# Patient Record
Sex: Female | Born: 1995
Health system: Southern US, Community
[De-identification: ages and names within clinical notes are randomized; demographics above are authoritative.]

## PROBLEM LIST (undated history)

## (undated) DIAGNOSIS — F419 Anxiety disorder, unspecified: Secondary | ICD-10-CM

## (undated) DIAGNOSIS — F319 Bipolar disorder, unspecified: Secondary | ICD-10-CM

## (undated) HISTORY — DX: Anxiety disorder, unspecified: F41.9

---
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
## 2015-09-11 DIAGNOSIS — Z309 Encounter for contraceptive management, unspecified: Secondary | ICD-10-CM | POA: Diagnosis not present

## 2015-11-27 DIAGNOSIS — Z3042 Encounter for surveillance of injectable contraceptive: Secondary | ICD-10-CM | POA: Diagnosis not present

## 2016-02-19 DIAGNOSIS — Z3042 Encounter for surveillance of injectable contraceptive: Secondary | ICD-10-CM | POA: Diagnosis not present

## 2016-06-17 DIAGNOSIS — Z309 Encounter for contraceptive management, unspecified: Secondary | ICD-10-CM | POA: Diagnosis not present

## 2016-09-29 DIAGNOSIS — Z3042 Encounter for surveillance of injectable contraceptive: Secondary | ICD-10-CM | POA: Diagnosis not present

## 2016-12-22 DIAGNOSIS — Z309 Encounter for contraceptive management, unspecified: Secondary | ICD-10-CM | POA: Diagnosis not present

## 2017-01-26 ENCOUNTER — Other Ambulatory Visit: Payer: Self-pay | Admitting: Physician Assistant

## 2017-01-26 ENCOUNTER — Other Ambulatory Visit (HOSPITAL_COMMUNITY)
Admission: RE | Admit: 2017-01-26 | Discharge: 2017-01-26 | Disposition: A | Discharge status: Home / Self Care | Payer: 59 | Source: Ambulatory Visit | Attending: Physician Assistant | Admitting: Physician Assistant

## 2017-01-26 DIAGNOSIS — Z0000 Encounter for general adult medical examination without abnormal findings: Secondary | ICD-10-CM | POA: Diagnosis not present

## 2017-01-26 DIAGNOSIS — N760 Acute vaginitis: Secondary | ICD-10-CM | POA: Diagnosis not present

## 2017-01-26 DIAGNOSIS — Z720 Tobacco use: Secondary | ICD-10-CM | POA: Diagnosis not present

## 2017-01-26 DIAGNOSIS — Z23 Encounter for immunization: Secondary | ICD-10-CM | POA: Diagnosis not present

## 2017-01-26 DIAGNOSIS — F1290 Cannabis use, unspecified, uncomplicated: Secondary | ICD-10-CM | POA: Diagnosis not present

## 2017-01-26 DIAGNOSIS — L301 Dyshidrosis [pompholyx]: Secondary | ICD-10-CM | POA: Diagnosis not present

## 2017-02-01 LAB — CYTOLOGY - PAP
Diagnosis: UNDETERMINED — AB
HPV: DETECTED — AB

## 2017-02-09 DIAGNOSIS — R87810 Cervical high risk human papillomavirus (HPV) DNA test positive: Secondary | ICD-10-CM | POA: Diagnosis not present

## 2017-02-09 DIAGNOSIS — R87610 Atypical squamous cells of undetermined significance on cytologic smear of cervix (ASC-US): Secondary | ICD-10-CM | POA: Diagnosis not present

## 2017-02-09 DIAGNOSIS — Z3042 Encounter for surveillance of injectable contraceptive: Secondary | ICD-10-CM | POA: Diagnosis not present

## 2017-03-23 DIAGNOSIS — Z3042 Encounter for surveillance of injectable contraceptive: Secondary | ICD-10-CM | POA: Diagnosis not present

## 2017-03-23 DIAGNOSIS — Z309 Encounter for contraceptive management, unspecified: Secondary | ICD-10-CM | POA: Diagnosis not present

## 2017-05-25 ENCOUNTER — Encounter: Payer: Self-pay | Admitting: Physician Assistant

## 2017-05-25 ENCOUNTER — Ambulatory Visit (INDEPENDENT_AMBULATORY_CARE_PROVIDER_SITE_OTHER): Payer: 59 | Admitting: Physician Assistant

## 2017-05-25 VITALS — BP 97/59 | HR 77 | Temp 97.7°F | Resp 18 | Ht 61.81 in | Wt 115.4 lb

## 2017-05-25 DIAGNOSIS — Z8659 Personal history of other mental and behavioral disorders: Secondary | ICD-10-CM

## 2017-05-25 DIAGNOSIS — R21 Rash and other nonspecific skin eruption: Secondary | ICD-10-CM | POA: Diagnosis not present

## 2017-05-25 MED ORDER — TRIAMCINOLONE ACETONIDE 0.5 % EX CREA: 1.0000 "application " | TOPICAL_CREAM | Freq: Two times a day (BID) | CUTANEOUS | 1 refills | Status: AC

## 2017-05-25 NOTE — Patient Instructions (Addendum)
Your rash is consistent with dyshidrotic eczema. Treat is topical corticosteroids are applied to the affected palms twice daily for two to four weeks. The risk of systemic absorption of topical corticosteroids applied on palms or soles is low because of the limited area involved and greater thickness of volar skin. However, a prolonged use of superpotent topical corticosteroids is associated with cutaneous side effects including skin atrophy, striae, and telangiectasia.  If rash worsens, please notify our office as you may need oral steroids.  I have also placed a referral for psychiatry and they should contact you within 1-2 weeks for an appointment. Please follow up with them.  Also, think about stopping smoking! Please let me know if you need help with this!   Thank you for letting me participate in your health and well being.     Hand Dermatitis Hand dermatitis is a skin condition that causes small, itchy, raised dots or fluid-filled blisters to form over the palms of the hands. This condition may also be called hand eczema. What are the causes? The cause of this condition is not known. What increases the risk? This condition is more likely to develop in people who have a history of allergies, such as:  Hay fever.  Allergic asthma.  An allergy to latex.  Chemical exposure, injuries, and environmental irritants can make hand dermatitis worse. Washing your hands too often can remove natural oils, which can dry out the skin and contribute to outbreaks of this condition. What are the signs or symptoms? The most common symptom of this condition is intense itchiness. Cracks or grooves (fissures) on the fingers can also develop. Affected areas can be painful, especially areas where large blisters have formed. How is this diagnosed? This condition is diagnosed with a medical history and physical exam. How is this treated? This condition is treated with medicines, including:  Steroid  creams and ointments.  Oral steroid medicines.  Antibiotic medicines. These are prescribed if you have an infection.  Antihistamine medicines. These help to reduce itchiness.  Follow these instructions at home:  Take or apply over-the-counter and prescription medicines only as told by your health care provider.  If you were prescribed an antibiotic medicine, use it as told by your health care provider. Do not stop using the antibiotic even if you start to feel better.  Avoid washing your hands more often than necessary.  Avoid using harsh chemicals on your hands.  Wear protective gloves when you handle products that can irritate your skin.  Keep all follow-up visits as told by your health care provider. This is important. Contact a health care provider if:  Your rash does not improve during the first week of treatment.  Your rash is red or tender.  Your rash has pus coming from it.  Your rash spreads. This information is not intended to replace advice given to you by your health care provider. Make sure you discuss any questions you have with your health care provider. Document Released: 08/23/2005 Document Revised: 01/29/2016 Document Reviewed: 03/07/2015 Elsevier Interactive Patient Education  2018 [employer].    Steps to Quit Smoking Smoking tobacco can be bad for your health. It can also affect almost every organ in your body. Smoking puts you and people around you at risk for many serious long-lasting (chronic) diseases. Quitting smoking is hard, but it is one of the best things that you can do for your health. It is never too late to quit. What are the benefits of quitting  smoking? When you quit smoking, you lower your risk for getting serious diseases and conditions. They can include:  Lung cancer or lung disease.  Heart disease.  Stroke.  Heart attack.  Not being able to have children (infertility).  Weak bones (osteoporosis) and broken bones  (fractures).  If you have coughing, wheezing, and shortness of breath, those symptoms may get better when you quit. You may also get sick less often. If you are pregnant, quitting smoking can help to lower your chances of having a baby of low birth weight. What can I do to help me quit smoking? Talk with your doctor about what can help you quit smoking. Some things you can do (strategies) include:  Quitting smoking totally, instead of slowly cutting back how much you smoke over a period of time.  Going to in-person counseling. You are more likely to quit if you go to many counseling sessions.  Using resources and support systems, such as: ? Agricultural engineer with a Veterinary surgeon. ? Phone quitlines. ? Automotive engineer. ? Support groups or group counseling. ? Text messaging programs. ? Mobile phone apps or applications.  Taking medicines. Some of these medicines may have nicotine in them. If you are pregnant or breastfeeding, do not take any medicines to quit smoking unless your doctor says it is okay. Talk with your doctor about counseling or other things that can help you.  Talk with your doctor about using more than one strategy at the same time, such as taking medicines while you are also going to in-person counseling. This can help make quitting easier. What things can I do to make it easier to quit? Quitting smoking might feel very hard at first, but there is a lot that you can do to make it easier. Take these steps:  Talk to your family and friends. Ask them to support and encourage you.  Call phone quitlines, reach out to support groups, or work with a Veterinary surgeon.  Ask people who smoke to not smoke around you.  Avoid places that make you want (trigger) to smoke, such as: ? Bars. ? Parties. ? Smoke-break areas at work.  Spend time with people who do not smoke.  Lower the stress in your life. Stress can make you want to smoke. Try these things to help your stress: ? Getting  regular exercise. ? Deep-breathing exercises. ? Yoga. ? Meditating. ? Doing a body scan. To do this, close your eyes, focus on one area of your body at a time from head to toe, and notice which parts of your body are tense. Try to relax the muscles in those areas.  Download or buy apps on your mobile phone or tablet that can help you stick to your quit plan. There are many free apps, such as QuitGuide from the [employer] developer for Disease Control and Prevention). You can find more support from smokefree.gov and other websites.  This information is not intended to replace advice given to you by your health care provider. Make sure you discuss any questions you have with your health care provider. Document Released: 06/19/2009 Document Revised: 04/20/2016 Document Reviewed: 01/07/2015 Elsevier Interactive Patient Education  2018 [employer].   IF you received an x-ray today, you will receive an invoice from [facility]. Please contact [facility] at [phone] with questions or concerns regarding your invoice.   IF you received labwork today, you will receive an invoice from [facility]. Please contact [facility] at [phone] with questions or concerns regarding your invoice.  Our billing staff will not be able to assist you with questions regarding bills from these companies.  You will be contacted with the lab results as soon as they are available. The fastest way to get your results is to activate your My Chart account. Instructions are located on the last page of this paperwork. If you have not heard from us regarding the results in 2 weeks, please contact this office.     

## 2017-05-25 NOTE — Progress Notes (Signed)
   Azadeh Hyder  MRN: [id number] DOB: 1995-12-10  Subjective:   Paula Ortiz is a 21 y.o. female who presents for evaluation of a rash involving the palms of hands. Rash started years ago. Lesions are mix flat lesions, dry lesions, and blisters. Rash has changed over time. It will come and go. Rash is painful and is very pruritic. Pt denies abdominal pain, arthralgia, congestion, cough, crankiness, decrease in appetite, fever, headache, irritability, myalgia, nausea, sore throat and vomiting. Patient has had contacts with similar rash. Her mom has similar symptoms.Her sister just got a staph infection from wearing dirty gloves and washing hands to  Patient has not had new exposures (soaps, lotions, laundry detergents, foods, medications, plants, insects or animals). She is sensitive to certain metals. No hx of seasonal allergies. Has tried OTC cortisone, ointment, hand cream with no full relief. 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 Does not know if it is related to stress. Sexually active with monogamous partner. Smokes a pack of cigarettes a week.    In terms of biploar disorder, pt is not currently on medication. Used to be followed by child psych but after age 8yo, "grew out of their clinic." Would like to see another psychiatrist and discuss medication initiation. Denies homicidal and suicidal thoughts/ideations today.   Review of Systems  Psychiatric/Behavioral: Positive for dysphoric mood.    There are no active problems to display for this patient.   No current outpatient prescriptions on file prior to visit.   No current facility-administered medications on file prior to visit.     Allergies  Allergen Reactions  . Ibuprofen Hives     Objective:  BP (!) 97/59 (BP Location: Right Arm, Patient Position: Sitting, Cuff Size: Normal)   Pulse 77   Temp 97.7 F (36.5 C) (Oral)   Resp 18   Ht 5' 1.81" (1.57 m)   Wt  115 lb 6.4 oz (52.3 kg)   SpO2 97%   BMI 21.24 kg/m   Physical Exam  Constitutional: She is oriented to person, place, and time and well-developed, well-nourished, and in no distress.  HENT:  Head: Normocephalic and atraumatic.  Eyes: Conjunctivae are normal.  Neck: Normal range of motion.  Pulmonary/Chest: Effort normal.  Neurological: She is alert and oriented to person, place, and time. Gait normal.  Skin: Skin is warm and dry.  Mild amount of vesicles with surrounding mild erythema and desquamation noted on palmar aspect of digits bilaterally. See image below.   Psychiatric: Affect normal.  Vitals reviewed.     Assessment and Plan :  1. Rash and nonspecific skin eruption Consistent with dyshidrotic eczema. Will treat with high potency topical corticosteroid. Pt encouraged to return to clinic if symptoms worsen, do not improve, or as needed - RPR - triamcinolone cream (TRIDERM) 0.5 %; Apply 1 application topically 2 (two) times daily. Only apply to palms of hands.  Dispense: 30 g; Refill: 1  2. History of bipolar disorder - Ambulatory referral to Psychiatry  Benjiman Core PA-C  Primary Care at [facility] 05/25/2017 10:16 AM

## 2017-05-25 NOTE — Progress Notes (Deleted)
Subjective:     Paula Ortiz is a 21 y.o. female who presents for evaluation of a rash involving the {body part:32401}. Rash started {1-10:13787} {time; units:19136} ago. Lesions are {color:5006}, and {text:16500} in texture. Rash {has/not:18111} changed over time. Rash {rash dc:16501}. Associated symptoms: {rash assoc:16502}. Patient denies: {rash assoc:19565}. Patient {has/not:18111} had contacts with similar rash. Patient {has/not:18111} had new exposures (soaps, lotions, laundry detergents, foods, medications, plants, insects or animals).  {Common ambulatory SmartLinks:19316}  Review of Systems {ros; complete:30496}    Objective:    BP (!) 97/59 (BP Location: Right Arm, Patient Position: Sitting, Cuff Size: Normal)   Pulse 77   Temp 97.7 F (36.5 C) (Oral)   Resp 18   Ht 5' 1.81" (1.57 m)   Wt 115 lb 6.4 oz (52.3 kg)   SpO2 97%   BMI 21.24 kg/m  General:  {gen appearance:16600}  Skin:  {skin exam:30902::"normal"}     Assessment:    {derm diagnosis:16511}    Plan:    {ZOXW:96045}

## 2017-05-26 LAB — RPR: RPR Ser Ql: NONREACTIVE

## 2017-07-13 DIAGNOSIS — Z309 Encounter for contraceptive management, unspecified: Secondary | ICD-10-CM | POA: Diagnosis not present

## 2017-07-14 DIAGNOSIS — Z309 Encounter for contraceptive management, unspecified: Secondary | ICD-10-CM | POA: Diagnosis not present

## 2017-07-14 DIAGNOSIS — Z3042 Encounter for surveillance of injectable contraceptive: Secondary | ICD-10-CM | POA: Diagnosis not present

## 2017-11-02 ENCOUNTER — Encounter: Payer: Self-pay | Admitting: Physician Assistant

## 2017-11-02 ENCOUNTER — Ambulatory Visit (INDEPENDENT_AMBULATORY_CARE_PROVIDER_SITE_OTHER): Payer: 59 | Admitting: Physician Assistant

## 2017-11-02 ENCOUNTER — Other Ambulatory Visit: Payer: Self-pay

## 2017-11-02 VITALS — BP 98/60 | HR 100 | Temp 99.7°F | Resp 18 | Ht 63.66 in | Wt 113.8 lb

## 2017-11-02 DIAGNOSIS — Z3040 Encounter for surveillance of contraceptives, unspecified: Secondary | ICD-10-CM

## 2017-11-02 DIAGNOSIS — F319 Bipolar disorder, unspecified: Secondary | ICD-10-CM | POA: Diagnosis not present

## 2017-11-02 LAB — POCT URINE PREGNANCY: Preg Test, Ur: NEGATIVE

## 2017-11-02 MED ORDER — MEDROXYPROGESTERONE ACETATE 150 MG/ML IM SUSP
150.0000 mg | Freq: Once | INTRAMUSCULAR | Status: AC
  Administered 2017-11-02: 150 mg via INTRAMUSCULAR

## 2017-11-02 NOTE — Progress Notes (Signed)
Paula Langonna Farner  MRN: [id number] DOB: 07/23/1996  Subjective:  Paula Ortiz is a 22 y.o. female seen in office today for a chief complaint of need of depo injection. Has been using this for over 1 year. Last depo shot 06/2017. Was getting shots at [facility], but wants to change to here due to miscommunication with appointments there. Denies dizziness, weakness, irritability, and weight gain. LMP 10/12/17. Has been sexually active since LMP with monogamous partner, has not used condoms. No concern for STDs.    In terms of biploar disorder, pt is not currently on medication. Used to be controlled on lamictal and zoloft. Was followed by child psych but after age 22yo, "grew out of their clinic."  Would like to see another psychiatrist and discuss medication initiation. Would also like to attend therapy. She endorses more dysphoric mood recently, increased episodes of crying, decreased interest in doing daily activities, and increase sleep. Has occasional suicidal thoughts, no active plan. Denies homicidal and suicidal thoughts/ideations today.   Review of Systems  Constitutional: Negative for chills, diaphoresis, fatigue and fever.  Psychiatric/Behavioral: Negative for hallucinations and self-injury. The patient is not nervous/anxious.     There are no active problems to display for this patient.   Current Outpatient Medications on File Prior to Visit  Medication Sig Dispense Refill  . medroxyPROGESTERone (DEPO-PROVERA) 150 MG/ML injection Inject 150 mg into the muscle every 3 (three) months.     No current facility-administered medications on file prior to visit.     Allergies  Allergen Reactions  . Ibuprofen Hives     Objective:  BP 98/60 (BP Location: Left Arm, Patient Position: Sitting, Cuff Size: Normal)   Pulse 100   Temp 99.7 F (37.6 C) (Oral)   Resp 18   Ht 5' 3.66" (1.617 m)   Wt 113 lb 12.8 oz (51.6 kg)   LMP 10/12/2017 (Approximate)   SpO2 99%   BMI 19.74 kg/m   Physical  Exam  Constitutional: She is oriented to person, place, and time and well-developed, well-nourished, and in no distress.  HENT:  Head: Normocephalic and atraumatic.  Eyes: Conjunctivae are normal.  Neck: Normal range of motion.  Pulmonary/Chest: Effort normal.  Neurological: She is alert and oriented to person, place, and time. Gait normal.  Skin: Skin is warm and dry.  Psychiatric: Affect normal.  Tearful when discussing mental health   Vitals reviewed.  Results for orders placed or performed in visit on 11/02/17 (from the past 24 hour(s))  POCT urine pregnancy     Status: None   Collection Time: 11/02/17 10:42 AM  Result Value Ref Range   Preg Test, Ur Negative Negative   [facility] 11/02/2017 05/25/2017  Decreased Interest 1 2  Down, Depressed, Hopeless 3 2  PHQ - 2 Score 4 4  Altered sleeping 3 1  Tired, decreased energy 1 3  Change in appetite 2 1  Feeling bad or failure about yourself  2 3  Trouble concentrating 3 2  Moving slowly or fidgety/restless 1 1  Suicidal thoughts 3 2  PHQ-9 Score 19 17  Difficult doing work/chores Very difficult Very difficult    Assessment and Plan :  1. Encounter for surveillance of contraceptives, unspecified contraceptive POC pregnancy test negative today. Depo injection given. Return in 2 weeks for repeat urine pregnancy. If neg at that time, continue with depo injections q 3 months. Enocuraged to use back up contraceptive method, such as condoms, for the next week.  - POCT  urine pregnancy - medroxyPROGESTERone (DEPO-PROVERA) injection 150 mg 2. Bipolar affective disorder, remission status unspecified (HCC) Due to mental health history, pt warrants further evaluation and tx from psychiatry. Would also likely benefit from therapy. Referral to psych placed. This was also placed about 5 months ago and pt never followed up. States she is definitely going to follow up this time. She was also given contact info for local  therapists. She is not currently suicidal. Given educational material for suicide help lines and [facility] eval if ever needed.  - Ambulatory referral to Psychiatry  Benjiman Core PA-C  Primary Care at [facility] 11/02/2017 10:50 AM

## 2017-11-02 NOTE — Patient Instructions (Addendum)
For depo shot, please use backup contraceptive method for the next 7 days such as a condom.  Return in 2 weeks for repeat urine pregnancy test. If negative, return for next depo shot at 3 months.   For depression, I have placed referral for psych. Below is info for therapy.  For therapy -- [facility] ([address] [place]) - [phone] [facility]) - [phone] [facility] - [phone] [facility] - [phone] [place] [place] - [phone] [facility]  - [phone] (do not file insurance)   If you ever start to have suicidal thoughts or a plan, please go to [facility] ED.   Suicidal Feelings: How to Help Yourself Suicide is the taking of one's own life. If you feel as though life is getting too tough to handle and are thinking about suicide, get help right away. To get help:  Call your local emergency services (911 in the [place]  Call a suicide hotline to speak with a trained counselor who understands how you are feeling. The following is a list of suicide hotlines in the [place]. For a list of hotlines in [place], visit InkDistributor.it. ? 1-800-273-TALK 972-003-8300). ? 1-800-SUICIDE 220-533-5140). ? (773) 032-7197. This is a hotline for Spanish speakers. ? 1-800-799-4TTY 6295558850). This is a hotline for TTY users. ? 1-866-4-U-TREVOR 951 865 9087). This is a hotline for lesbian, gay, bisexual, transgender, or questioning youth.  Contact a crisis center or a local suicide prevention center. To find a crisis center or suicide prevention center: ? Call your local hospital, clinic, community service organization, mental health center, social service provider, or health department. Ask for assistance in connecting to a crisis center. ? Visit  https://www.patel-king.com/ for a list of crisis centers in the [place], or visit www.suicideprevention.ca/thinking-about-suicide/find-a-crisis-centre for a list of centers in [place].  Visit the following websites: ? National Suicide Prevention Lifeline: www.suicidepreventionlifeline.org ? Hopeline: www.hopeline.com ? [employer] for Suicide Prevention: https://www.ayers.com/ ? [employer] (for lesbian, gay, bisexual, transgender, or questioning youth): www.thetrevorproject.org  How can I help myself feel better?  Promise yourself that you will not do anything drastic when you have suicidal feelings. Remember, there is hope. Many people have gotten through suicidal thoughts and feelings, and you will, too. You may have gotten through them before, and this proves that you can get through them again.  Let family, friends, teachers, or counselors know how you are feeling. Try not to isolate yourself from those who care about you. Remember, they will want to help you. Talk with someone every day, even if you do not feel sociable. Face-to-face conversation is best.  Call a mental health professional and see one regularly.  Visit your primary health care provider every year.  Eat a well-balanced diet, and space your meals so you eat regularly.  Get plenty of rest.  Avoid alcohol and drugs, and remove them from your home. They will only make you feel worse.  If you are thinking of taking a lot of medicine, give your medicine to someone who can give it to you one day at a time. If you are on antidepressants and are concerned you will overdose, let your health care provider know so he or she can give you safer medicines. Ask your mental health professional about the possible side effects of any medicines you are taking.  Remove weapons, poisons, knives, and anything else that could harm you from your home.  Try  to stick to routines. Follow a schedule every day.  Put self-care on your schedule.  Make a list of realistic goals, and cross them off when you achieve them. Accomplishments give a sense of worth.  Wait until you are feeling better before doing the things you find difficult or unpleasant.  Exercise if you are able. You will feel better if you exercise for even a half hour each day.  Go out in the sun or into nature. This will help you recover from depression faster. If you have a favorite place to walk, go there.  Do the things that have always given you pleasure. Play your favorite music, read a good book, paint a picture, play your favorite instrument, or do anything else that takes your mind off your depression if it is safe to do.  Keep your living space well lit.  When you are feeling well, write yourself a letter about tips and support that you can read when you are not feeling well.  Remember that life's difficulties can be sorted out with help. Conditions can be treated. You can work on thoughts and strategies that serve you well. This information is not intended to replace advice given to you by your health care provider. Make sure you discuss any questions you have with your health care provider. Document Released: 02/27/2003 Document Revised: 04/21/2016 Document Reviewed: 12/18/2013 Elsevier Interactive Patient Education  2018 [employer]  IF you received an x-ray today, you will receive an invoice from [facility]. Please contact [facility] at [phone] with questions or concerns regarding your invoice.   IF you received labwork today, you will receive an [facility] from [facility]. Please contact [facility] at [phone] with questions or concerns regarding your invoice.   Our billing staff will not be able to assist you with questions regarding bills from these companies.  You will be contacted with the lab results as soon as they are available. The fastest way to get your results is to activate your My  Chart account. Instructions are located on the last page of this paperwork. If you have not heard from us regarding the results in 2 weeks, please contact this office.

## 2017-11-21 ENCOUNTER — Encounter: Payer: Self-pay | Admitting: Physician Assistant

## 2017-11-21 ENCOUNTER — Ambulatory Visit (INDEPENDENT_AMBULATORY_CARE_PROVIDER_SITE_OTHER): Payer: 59 | Admitting: Physician Assistant

## 2017-11-21 ENCOUNTER — Other Ambulatory Visit: Payer: Self-pay

## 2017-11-21 VITALS — BP 100/64 | HR 102 | Temp 99.0°F | Resp 18 | Ht 62.6 in | Wt 113.6 lb

## 2017-11-21 DIAGNOSIS — M25561 Pain in right knee: Secondary | ICD-10-CM | POA: Diagnosis not present

## 2017-11-21 DIAGNOSIS — F319 Bipolar disorder, unspecified: Secondary | ICD-10-CM

## 2017-11-21 DIAGNOSIS — M7631 Iliotibial band syndrome, right leg: Secondary | ICD-10-CM | POA: Diagnosis not present

## 2017-11-21 DIAGNOSIS — M25551 Pain in right hip: Secondary | ICD-10-CM

## 2017-11-21 DIAGNOSIS — Z3042 Encounter for surveillance of injectable contraceptive: Secondary | ICD-10-CM

## 2017-11-21 DIAGNOSIS — G8929 Other chronic pain: Secondary | ICD-10-CM | POA: Diagnosis not present

## 2017-11-21 DIAGNOSIS — M25552 Pain in left hip: Secondary | ICD-10-CM

## 2017-11-21 NOTE — Patient Instructions (Addendum)
Iliotibial Band Syndrome Iliotibial band syndrome (ITBS) is a condition that often causes knee pain. It can also cause pain in the outside of your hip, thigh, and knee. The iliotibial band is a strip of tissue that runs from the outside of your hip and down your thigh to the outside of your knee. Repeatedly bending and straightening your knee can irritate the iliotibial band. What are the causes? This condition is caused by inflammation and irritation from the friction of the iliotibial band moving over the thigh bone (femur) when you repeatedly bend and straighten your knee. What increases the risk? This condition is more likely to develop in people who:  Frequently change elevation during their workouts.  Run very long distances.  Recently increased the length or intensity of their workouts.  Run downhill often, or just started running downhill.  Ride a bike very far or often.  You may also be at greater risk if you start a new workout routine without first warming up or if you have a job that requires you to bend, squat, or climb frequently. What are the signs or symptoms? Symptoms of this condition include:  Pain along the outside of your knee that may be worse with activity, especially running or going up and down stairs.  A "snapping" sensation over your knee.  Swelling on the outside of your knee.  Pain or a feeling of tightness in your hip.  How is this diagnosed? This condition is diagnosed based on your symptoms, medical history, and physical exam. You may also see a health care provider who specializes in reducing pain and increasing mobility (physical therapist). A physical therapist may do an exam to check your balance, movement, and way of walking or running (gait) to see whether the way you move could contribute to your injury. You may also have tests to measure your strength, flexibility, and range of motion. How is this treated? Treatment for this condition  includes:  Resting and limiting exercise.  Returning to activities gradually.  Doing range-of-motion and strengthening exercises (physical therapy) as told by your health care provider.  Including low-impact activities, such as swimming, in your exercise routine.  Follow these instructions at home:  If directed, apply ice to the injured area. ? Put ice in a plastic bag. ? Place a towel between your skin and the bag. ? Leave the ice on for 20 minutes, 2-3 times per day.  Return to your normal activities as told by your health care provider. Ask your health care provider what activities are safe for you.  Keep all follow-up visits with your health care provider. This is important. Contact a health care provider if:  Your pain does not improve or gets worse despite treatment. This information is not intended to replace advice given to you by your health care provider. Make sure you discuss any questions you have with your health care provider. Document Released: 02/12/2002 Document Revised: 09/24/2016 Document Reviewed: 09/24/2016 Elsevier Interactive Patient Education  2018 [employer]  Iliotibial Band Syndrome Rehab Ask your health care provider which exercises are safe for you. Do exercises exactly as told by your health care provider and adjust them as directed. It is normal to feel mild stretching, pulling, tightness, or discomfort as you do these exercises, but you should stop right away if you feel sudden pain or your pain gets worse.Do not begin these exercises until told by your health care provider. Stretching and range of motion exercises These exercises warm up  your muscles and joints and improve the movement and flexibility of your hip and pelvis. Exercise A: Quadriceps, prone  1. Lie on your abdomen on a firm surface, such as a bed or padded floor. 2. Bend your left / right knee and hold your ankle. If you cannot reach your ankle or pant leg, loop a belt around your  foot and grab the belt instead. 3. Gently pull your heel toward your buttocks. Your knee should not slide out to the side. You should feel a stretch in the front of your thigh and knee. 4. Hold this position for __________ seconds. Repeat __________ times. Complete this stretch __________ times a day. Exercise B: Iliotibial band  1. Lie on your side with your left / right leg in the top position. 2. Bend both of your knees and grab your left / right ankle. Stretch out your bottom arm to help you balance. 3. Slowly bring your top knee back so your thigh goes behind your trunk. 4. Slowly lower your top leg toward the floor until you feel a gentle stretch on the outside of your left / right hip and thigh. If you do not feel a stretch and your knee will not fall farther, place the heel of your other foot on top of your knee and pull your knee down toward the floor with your foot. 5. Hold this position for __________ seconds. Repeat __________ times. Complete this stretch __________ times a day. Strengthening exercises These exercises build strength and endurance in your hip and pelvis. Endurance is the ability to use your muscles for a long time, even after they get tired. Exercise C: Straight leg raises ( hip abductors) 1. Lie on your side with your left / right leg in the top position. Lie so your head, shoulder, knee, and hip line up. You may bend your bottom knee to help you balance. 2. Roll your hips slightly forward so your hips are stacked directly over each other and your left / right knee is facing forward. 3. Tense the muscles in your outer thigh and lift your top leg 4-6 inches (10-15 cm). 4. Hold this position for __________ seconds. 5. Slowly return to the starting position. Let your muscles relax completely before doing another repetition. Repeat __________ times. Complete this exercise __________ times a day. Exercise D: Straight leg raises ( hip extensors) 1. Lie on your abdomen  on your bed or a firm surface. You can put a pillow under your hips if that is more comfortable. 2. Bend your left / right knee so your foot is straight up in the air. 3. Squeeze your buttock muscles and lift your left / right thigh off the bed. Do not let your back arch. 4. Tense this muscle as hard as you can without increasing any knee pain. 5. Hold this position for __________ seconds. 6. Slowly lower your leg to the starting position and allow it to relax completely. Repeat __________ times. Complete this exercise __________ times a day. Exercise E: Hip hike 1. Stand sideways on a bottom step. Stand on your left / right leg with your other foot unsupported next to the step. You can hold onto the railing or wall if needed for balance. 2. Keep your knees straight and your torso square. Then, lift your left / right hip up toward the ceiling. 3. Slowly let your left / right hip lower toward the floor, past the starting position. Your foot should get closer to the floor. Do not  lean or bend your knees. Repeat __________ times. Complete this exercise __________ times a day. This information is not intended to replace advice given to you by your health care provider. Make sure you discuss any questions you have with your health care provider. Document Released: 08/23/2005 Document Revised: 04/27/2016 Document Reviewed: 07/25/2015 Elsevier Interactive Patient Education  2018 [employer].    IF you received an x-ray today, you will receive an invoice from [facility]. Please contact [facility] at [phone] with questions or concerns regarding your invoice.   IF you received labwork today, you will receive an invoice from [facility]. Please contact [facility] at [phone] with questions or concerns regarding your invoice.   Our billing staff will not be able to assist you with questions regarding bills from these companies.  You will be contacted with the lab results as soon  as they are available. The fastest way to get your results is to activate your My Chart account. Instructions are located on the last page of this paperwork. If you have not heard from [place] regarding the results in 2 weeks, please contact this office.

## 2017-11-21 NOTE — Progress Notes (Signed)
Paula Langonna Bily  MRN: [id number] DOB: 05/11/1996  Subjective:  Paula Ortiz is a 22 y.o. female seen in office today for a chief complaint of follow up on pregnancy test and depression. Receives depo injection for contraception but was late for last injection 2 weeks ago. Had been sexually active. POC preg test 2 weeks ago negative, depo shot given. Encouraged to return in 2 weeks to ensure negative pregnancy. Pt has not been sexually active since last visit. She was also given referral at last visit for psych.  Notes they contacted her and she has an appointment with him in May.  She would also like to discuss bilateral hip pain (R>L) and right knee pain x years. Pain will often extend down lateral thigh to lateral knee. Denies swelling, redness, warmth, numbness, and tingling. Onset of the symptoms was 2 years ago. Inciting event: none.. Pain is intermittent. Aggravating symptoms include: sitting. Patient has had no prior hip or knee problems.  She used to be very active in high school sports.  She most the day when she is at work.  Does not do much strenuous exercise.  Does not stretch. Previous visits for this problem: none. Evaluation to date: none. Treatment to date: stretching, marijuana, OTC analgesics, which have been effective.  Review of Systems  Constitutional: Negative for chills, diaphoresis and fever.  Gastrointestinal: Negative for abdominal pain, nausea and vomiting.  Musculoskeletal: Negative for back pain and joint swelling.  Neurological: Negative for dizziness.  Psychiatric/Behavioral: Negative for suicidal ideas.    There are no active problems to display for this patient.   No current outpatient medications on file prior to visit.   No current facility-administered medications on file prior to visit.     Allergies  Allergen Reactions  . Ibuprofen Hives      Objective:  BP 100/64 (BP Location: Left Arm, Patient Position: Sitting, Cuff Size: Normal)   Pulse (!) 102    Temp 99 F (37.2 C) (Oral)   Resp 18   Ht 5' 2.6" (1.59 m)   Wt 113 lb 9.6 oz (51.5 kg)   SpO2 96%   BMI 20.38 kg/m   Physical Exam  Constitutional: She is oriented to person, place, and time and well-developed, well-nourished, and in no distress.  HENT:  Head: Normocephalic and atraumatic.  Eyes: Conjunctivae are normal.  Neck: Normal range of motion.  Pulmonary/Chest: Effort normal.  Musculoskeletal:       Right hip: She exhibits tenderness (with palpation of tensor fascia latae). She exhibits normal range of motion, normal strength, no bony tenderness and no swelling.       Left hip: She exhibits normal range of motion, normal strength, no tenderness, no bony tenderness and no swelling.       Right knee: Normal. She exhibits normal range of motion, no swelling, no effusion and no ecchymosis. No tenderness found.       Left knee: She exhibits normal range of motion, no swelling, no effusion and no ecchymosis. No tenderness found.  Right knee exam: Negative McMurray, anterior/posterior drawer, valgus and varus stress tests.  Neurological: She is alert and oriented to person, place, and time. Gait normal.  Reflex Scores:      Patellar reflexes are 2+ on the right side and 2+ on the left side.      Achilles reflexes are 2+ on the right side and 2+ on the left side. Muscular strength 5/5 of bilateral lower extremities.  Sensation of bilateral lower extremities intact.  Skin: Skin is warm and dry.  Psychiatric: Affect normal.  Vitals reviewed.   No results found for this or any previous visit (from the past 24 hour(s)).  Assessment and Plan :  1. Encounter for surveillance of injectable contraceptive Pt could not provide [place] with a urine sample and agreed to do lab work instead. Labs pending.  - Beta hCG quant (ref lab) 2. Bipolar affective disorder, remission status unspecified (HCC) Follow up with scheduled psych appointment.  3. Pain of both hip joints 4. Chronic pain of  right knee 5. Iliotibial band syndrome of right side Hx and PE findings suspicious for ITB syndrome (R>L). Pain with palpation of right TFL. No bony tenderness noted on exam. Recommended sx tx at this time. Given educational material for stretching. Advised to return to clinic if symptoms worsen, do not improve with current tx plan, or as needed. If no improvement, consider referral to PT.   Benjiman Core PA-C  Primary [facility] at [facility] 11/21/2017 12:25 PM

## 2017-11-22 LAB — BETA HCG QUANT (REF LAB): hCG Quant: <1 m[IU]/mL

## 2017-11-23 ENCOUNTER — Encounter: Payer: Self-pay | Admitting: Physician Assistant

## 2017-11-23 ENCOUNTER — Encounter: Payer: Self-pay | Admitting: Radiology

## 2017-12-28 ENCOUNTER — Telehealth (HOSPITAL_COMMUNITY): Payer: Self-pay

## 2018-01-09 ENCOUNTER — Encounter (HOSPITAL_COMMUNITY): Payer: Self-pay | Admitting: Psychiatry

## 2018-01-09 ENCOUNTER — Ambulatory Visit (INDEPENDENT_AMBULATORY_CARE_PROVIDER_SITE_OTHER): Payer: 59 | Admitting: Psychiatry

## 2018-01-09 VITALS — BP 110/68 | HR 89 | Ht 61.0 in | Wt 107.6 lb

## 2018-01-09 DIAGNOSIS — F411 Generalized anxiety disorder: Secondary | ICD-10-CM | POA: Diagnosis not present

## 2018-01-09 DIAGNOSIS — Z87891 Personal history of nicotine dependence: Secondary | ICD-10-CM

## 2018-01-09 DIAGNOSIS — Z630 Problems in relationship with spouse or partner: Secondary | ICD-10-CM

## 2018-01-09 DIAGNOSIS — R450 Nervousness: Secondary | ICD-10-CM | POA: Diagnosis not present

## 2018-01-09 DIAGNOSIS — F319 Bipolar disorder, unspecified: Secondary | ICD-10-CM | POA: Diagnosis not present

## 2018-01-09 DIAGNOSIS — Z818 Family history of other mental and behavioral disorders: Secondary | ICD-10-CM

## 2018-01-09 DIAGNOSIS — G4700 Insomnia, unspecified: Secondary | ICD-10-CM

## 2018-01-09 DIAGNOSIS — F4010 Social phobia, unspecified: Secondary | ICD-10-CM

## 2018-01-09 DIAGNOSIS — Z915 Personal history of self-harm: Secondary | ICD-10-CM

## 2018-01-09 MED ORDER — LAMOTRIGINE 25 MG PO TABS: ORAL_TABLET | ORAL | 0 refills | Status: DC

## 2018-01-09 MED ORDER — SERTRALINE HCL 50 MG PO TABS: ORAL_TABLET | ORAL | 0 refills | Status: DC

## 2018-01-09 NOTE — Progress Notes (Signed)
Psychiatric Initial Adult Assessment   Patient Identification: Paula Ortiz MRN:  [id number] Date of Evaluation:  01/09/2018 Referral Source: Primary care physician.  Chief Complaint:  I want to go back on my medication.  Visit Diagnosis: No diagnosis found.  History of Present Illness:  Paula Ortiz is a 22 year old Caucasian, employed, part-time student single female who came for her initial appointment.  Patient told that she had a history of bipolar disorder and she was prescribed Lamictal and Zoloft 3 years ago which she took for 1 year but stopped after she was feeling better.  She noticed her symptoms coming back in recent months.  She is been experiencing irritability, mood swing, agitation, anger, mania, feeling grandiose and passive suicidal thoughts.  In January she had a broke up with her boyfriend.  She was in a relationship for 4 years.  She regret losing her relationship.  She believed break-up was due to her unstable psychiatric condition.  She is serious to get help.  She admitted smoking weed and snorting cocaine but stopped last August.  She admitted very impulsive at times and causing self-destruction and better choices.  Lately she noticed easily tearful, crying, there are days when she does not get out of the bed and feels isolated, withdrawn but lack of motivation to do things.  She endorsed having cycles of mania when she feel very grandiose, irritable, severe anger and paranoid about her friends.  She has been sleeping only a few hours and noticed lack of appetite and weight loss.  Patient like to go back on Lamictal and Zoloft which helped her in the past.  Currently she is not seeing any therapist but like to see someone in the office.  Associated Signs/Symptoms: Depression Symptoms:  depressed mood, anhedonia, insomnia, psychomotor agitation, feelings of worthlessness/guilt, difficulty concentrating, hopelessness, anxiety, loss of energy/fatigue, disturbed sleep, (Hypo) Manic  Symptoms:  Distractibility, Flight of Ideas, Grandiosity, Impulsivity, Irritable Mood, Labiality of Mood, Anxiety Symptoms:  Excessive Worry, Social Anxiety, Psychotic Symptoms:  Paranoia, PTSD Symptoms: Negative  Past Psychiatric History: Patient had a history of bipolar disorder.  She was seeing Dr. Yetta Barre who is a pediatric psychiatrist at [facility].  She was diagnosed bipolar at age 30 but remember having symptoms most of her life.  She had a good response with Lamictal and Zoloft.  Patient denies any history of psychiatric inpatient treatment or any suicidal attempt.  She reported history of smoking cocaine and cannabis but claims to be sober since August 2018.  Previous Psychotropic Medications: Yes   Substance Abuse History in the last 12 months:  Yes.    Consequences of Substance Abuse: Negative  Past Medical History:  Past Medical History:  Diagnosis Date  . Anxiety    History reviewed. No pertinent surgical history.  Family Psychiatric History: Mother has bipolar disorder.  Family History:  Family History  Problem Relation Age of Onset  . Cancer Maternal Grandmother     Social History:   Social History   Socioeconomic History  . Marital status: Single    Spouse name: Not on file  . Number of children: 0  . Years of education: Not on file  . Highest education level: Bachelor's degree (e.g., BA, AB, BS)  Occupational History  . Not on file  Social Needs  . Financial resource strain: Not hard at all  . Food insecurity:    Worry: Never true    Inability: Never true  . Transportation needs:    Medical: No  Non-medical: No  Tobacco Use  . Smoking status: Former Smoker    Packs/day: 1.00    Last attempt to quit: 08/16/2017    Years since quitting: 0.4  . Smokeless tobacco: Never Used  Substance and Sexual Activity  . Alcohol use: Yes    Comment: very rare  . Drug use: Yes    Types: Marijuana    Comment: not really  . Sexual activity: Not  Currently    Birth control/protection: Injection  Lifestyle  . Physical activity:    Days per week: 0 days    Minutes per session: 0 min  . Stress: Very much  Relationships  . Social connections:    Talks on phone: More than three times a week    Gets together: More than three times a week    Attends religious service: Never    Active member of club or organization: No    Attends meetings of clubs or organizations: Never    Relationship status: Never married  Other Topics Concern  . Not on file  Social History Narrative  . Not on file    Additional Social History: Patient born and raised in [place].  She had a good support from her parents.  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    Allergies:   Allergies  Allergen Reactions  . Ibuprofen Hives    Metabolic Disorder Labs: No results found for: HGBA1C, MPG No results found for: PROLACTIN No results found for: CHOL, TRIG, HDL, CHOLHDL, VLDL, LDLCALC   Current Medications: No current outpatient medications on file.   No current facility-administered medications for this visit.     Neurologic: Headache: Yes Seizure: No Paresthesias:No  Musculoskeletal: Strength & Muscle Tone: within normal limits Gait & Station: normal Patient leans: N/A  Psychiatric Specialty Exam: Review of Systems  Constitutional: Positive for weight loss.  HENT: Negative.   Skin: Negative.   Psychiatric/Behavioral: Positive for depression. The patient is nervous/anxious and has insomnia.     Blood pressure 110/68, pulse 89, height  (1.549 m), weight 107 lb 9.6 oz (48.8 kg).Body mass index is 20.33 kg/m.  General Appearance: Casual  Eye Contact:  Fair  Speech:  Clear and Coherent  Volume:  Normal  Mood:  Anxious, Depressed, Hopeless and Irritable  Affect:  Constricted and Depressed  Thought Process:  Goal Directed   Orientation:  Full (Time, Place, and Person)  Thought Content:  Paranoid Ideation and Rumination  Suicidal Thoughts:  No  Homicidal Thoughts:  No  Memory:  Immediate;   Good Recent;   Good Remote;   Good  Judgement:  Good  Insight:  Fair  Psychomotor Activity:  Increased  Concentration:  Concentration: Good and Attention Span: Good  Recall:  Good  Fund of Knowledge:Good  Language: Good  Akathisia:  No  Handed:  Right  AIMS (if indicated):  0  Assets:  Communication Skills Desire for Improvement Housing Social Support  ADL's:  Intact  Cognition: WNL  Sleep:  poor   Assessment: Bipolar disorder type I.  Generalized anxiety disorder.  Plan: I review her symptoms, history, psychosocial stressors and current medication.  Patient had a good response with Lamictal and Zoloft.  She do not recall any side effects.  I will start Lamictal 25 mg daily for 1 week and then 50 mg daily.  I will also start Zoloft 25 mg daily  for 1 week and then 50 mg daily.  Discussed medication side effects and benefits.  Reminded her that if she had a rash then she need to stop the medication immediately.  Discussed safety concerns at any time having active suicidal thoughts or homicidal thought and she need to call 911 or go to local emergency room.  We will schedule appointment to see her therapist in this office for coping skills.  Follow-up in 4 weeks.  Cleotis Nipper, MD 5/6/201911:34 AM

## 2018-01-25 ENCOUNTER — Ambulatory Visit (INDEPENDENT_AMBULATORY_CARE_PROVIDER_SITE_OTHER): Payer: Self-pay | Admitting: Nurse Practitioner

## 2018-01-25 VITALS — BP 90/65 | HR 79 | Temp 97.8°F | Resp 16 | Wt 107.2 lb

## 2018-01-25 DIAGNOSIS — J019 Acute sinusitis, unspecified: Secondary | ICD-10-CM

## 2018-01-25 MED ORDER — FLUTICASONE PROPIONATE 50 MCG/ACT NA SUSP: 2.0000 | Freq: Every day | NASAL | 0 refills | Status: DC

## 2018-01-25 MED ORDER — AMOXICILLIN-POT CLAVULANATE 875-125 MG PO TABS: 1.0000 | ORAL_TABLET | Freq: Two times a day (BID) | ORAL | 0 refills | Status: AC

## 2018-01-25 NOTE — Progress Notes (Signed)
Subjective:  Paula Ortiz is a 22 y.o. female who presents for evaluation of possible sinusitis.  Symptoms include right ear pressure/pain, achiness, chills, headache described as aching, post nasal drip, productive cough with yellow colored sputum, sinus pressure, sinus pain, sneezing and sore throat.  Onset of symptoms was 7 days ago, and has been gradually worsening since that time.  Treatment to date:  cough suppressants and decongestants.  High risk factors for influenza complications:  none.  The following portions of the patient's history were reviewed and updated as appropriate:  allergies, current medications and past medical history.  Constitutional: positive for chills and fatigue, negative for anorexia, malaise and sweats Eyes: negative Ears, nose, mouth, throat, and face: positive for nasal congestion, sore throat and right ear pressure, negative for ear drainage and hoarseness Respiratory: positive for cough and sputum, negative for asthma, dyspnea on exertion, emphysema, stridor and wheezing Cardiovascular: negative Gastrointestinal: negative Neurological: positive for headaches, negative for coordination problems, gait problems, seizures, speech problems, tremors, vertigo and weakness Allergic/Immunologic: positive for hay fever Objective:  BP 90/65 (BP Location: Right Arm, Patient Position: Sitting, Cuff Size: Normal)   Pulse 79   Temp 97.8 F (36.6 C) (Oral)   Resp 16   Wt 107 lb 3.2 oz (48.6 kg)   SpO2 98%   BMI 20.26 kg/m  General appearance: alert, cooperative, fatigued and no distress Head: Normocephalic, without obvious abnormality, atraumatic Eyes: conjunctivae/corneas clear. PERRL, EOM's intact. Fundi benign. Ears: abnormal TM right ear - mucoid middle ear fluid Nose: no discharge, turbinates swollen, inflamed, moderate maxillary sinus tenderness right, mild frontal sinus tenderness bilateral Throat: lips, mucosa, and tongue normal; teeth and gums normal Lungs:  clear to auscultation bilaterally Heart: regular rate and rhythm, S1, S2 normal, no murmur, click, rub or gallop Abdomen: soft, non-tender; bowel sounds normal; no masses,  no organomegaly Pulses: 2+ and symmetric Skin: Skin color, texture, turgor normal. No rashes or lesions Lymph nodes: cervical and submandibular nodes normal Neurologic: Grossly normal    Assessment:  Acute Sinusitis    Plan:  Discussed diagnosis and treatment of sinusitis. Educational material distributed and questions answered. Suggested symptomatic OTC remedies. Nasal saline spray for congestion. Augmentin per orders. Nasal steroids per orders. Follow up as needed.  Meds ordered this encounter  Medications  . amoxicillin-clavulanate (AUGMENTIN) 875-125 MG tablet    Sig: Take 1 tablet by mouth 2 (two) times daily for 10 days.    Dispense:  20 tablet    Refill:  0    Order Specific Question:   Supervising Provider    Answer:   Stacie Glaze [[id number]]  . fluticasone (FLONASE) 50 MCG/ACT nasal spray    Sig: Place 2 sprays into both nostrils daily for 10 days.    Dispense:  16 g    Refill:  0    Order Specific Question:   Supervising Provider    Answer:   Stacie Glaze [phone]

## 2018-01-25 NOTE — Patient Instructions (Signed)

## 2018-01-27 ENCOUNTER — Telehealth: Payer: Self-pay

## 2018-01-27 NOTE — Telephone Encounter (Signed)
Tried reaching out to patient regarding her visit with [place] and pt hung up the phone on me.

## 2018-02-13 ENCOUNTER — Ambulatory Visit (INDEPENDENT_AMBULATORY_CARE_PROVIDER_SITE_OTHER): Payer: 59 | Admitting: Psychiatry

## 2018-02-13 ENCOUNTER — Encounter (HOSPITAL_COMMUNITY): Payer: Self-pay | Admitting: Psychiatry

## 2018-02-13 DIAGNOSIS — R454 Irritability and anger: Secondary | ICD-10-CM

## 2018-02-13 DIAGNOSIS — F1211 Cannabis abuse, in remission: Secondary | ICD-10-CM

## 2018-02-13 DIAGNOSIS — R450 Nervousness: Secondary | ICD-10-CM

## 2018-02-13 DIAGNOSIS — Z87891 Personal history of nicotine dependence: Secondary | ICD-10-CM

## 2018-02-13 DIAGNOSIS — F1099 Alcohol use, unspecified with unspecified alcohol-induced disorder: Secondary | ICD-10-CM | POA: Diagnosis not present

## 2018-02-13 DIAGNOSIS — F411 Generalized anxiety disorder: Secondary | ICD-10-CM

## 2018-02-13 DIAGNOSIS — R4587 Impulsiveness: Secondary | ICD-10-CM | POA: Diagnosis not present

## 2018-02-13 DIAGNOSIS — F319 Bipolar disorder, unspecified: Secondary | ICD-10-CM | POA: Diagnosis not present

## 2018-02-13 MED ORDER — SERTRALINE HCL 50 MG PO TABS: 50.0000 mg | ORAL_TABLET | Freq: Every day | ORAL | 1 refills | Status: DC

## 2018-02-13 MED ORDER — LAMOTRIGINE 100 MG PO TABS: 100.0000 mg | ORAL_TABLET | Freq: Every day | ORAL | 1 refills | Status: DC

## 2018-02-13 NOTE — Progress Notes (Signed)
BH MD/PA/NP OP Progress Note  02/13/2018 12:35 PM Paula Ortiz  MRN:  [id number]  Chief Complaint: I am feeling better but still I have anxiety and irritability.  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  She was seen first time 4 weeks ago.  She has a history of bipolar disorder and anxiety.  She came because she was having symptoms of mania, irritability, mood swings and highs and lows.  In January she had a broke up with her boyfriend.  She was in a relationship for past 4 years.  She regret losing her relationship because of her symptoms.  She had a good response with Lamictal and Zoloft.  We started her back on Lamictal and Zoloft.  She is taking 25 mg Lamictal twice a day and Zoloft 50 mg daily.  She noticed improvement in her irritability and impulsive behavior but she still feel anxious and sometime nervous.  Her sleep is fair.  She still have episodes of crying and she regret about losing her relationship.  She is working and also a Consulting civil engineer at [employer]  She denies any paranoia or any hallucination.  She denies any suicidal thoughts or homicidal thought.  She gets emotional and tearful when she talked about her past.  She feels proud that she is not drinking or using any illegal substances.  Her appetite is is fair.  She is more social and active.  She has no rash, itching, tremors or shakes.  Patient lives with her parents and siblings.  She had a good support from her parents.  Visit Diagnosis:    ICD-10-CM   1. GAD (generalized anxiety disorder) F41.1 sertraline (ZOLOFT) 50 MG tablet  2. Bipolar I disorder (HCC) F31.9 lamoTRIgine (LAMICTAL) 100 MG tablet    Past Psychiatric History: Reviewed. Patient was seeing pediatric specialist Dr. Yetta Barre at [facility] since age 33.  She diagnosed with bipolar disorder.  She had a good response with Lamictal and Zoloft.  She also took Abilify.  Patient denies any history of psychiatric  inpatient treatment or any suicidal attempt.  She had a history of smoking cocaine and cannabis but claims to be sober since August 2018.  Past Medical History:  Past Medical History:  Diagnosis Date  . Anxiety    No past surgical history on file.  Family Psychiatric History: Reviewed.  Family History:  Family History  Problem Relation Age of Onset  . Cancer Maternal Grandmother     Social History:  Social History   Socioeconomic History  . Marital status: Single    Spouse name: Not on file  . Number of children: 0  . Years of education: Not on file  . Highest education level: Bachelor's degree (e.g., BA, AB, BS)  Occupational History  . Not on file  Social Needs  . Financial resource strain: Not hard at all  . Food insecurity:    Worry: Never true    Inability: Never true  . Transportation needs:    Medical: No    Non-medical: No  Tobacco Use  . Smoking status: Former Smoker    Packs/day: 1.00    Last attempt to quit: 08/16/2017    Years since quitting: 0.4  . Smokeless tobacco: Never Used  Substance and Sexual Activity  . Alcohol use: Yes    Comment: very rare  . Drug use: Yes    Types: Marijuana    Comment: not really  . Sexual activity: Not Currently  Birth control/protection: Injection  Lifestyle  . Physical activity:    Days per week: 0 days    Minutes per session: 0 min  . Stress: Very much  Relationships  . Social connections:    Talks on phone: More than three times a week    Gets together: More than three times a week    Attends religious service: Never    Active member of club or organization: No    Attends meetings of clubs or organizations: Never    Relationship status: Never married  Other Topics Concern  . Not on file  Social History Narrative  . Not on file    Allergies:  Allergies  Allergen Reactions  . Ibuprofen Hives    Metabolic Disorder Labs: No results found for: HGBA1C, MPG No results found for: PROLACTIN No results  found for: CHOL, TRIG, HDL, CHOLHDL, VLDL, LDLCALC No results found for: TSH  Therapeutic Level Labs: No results found for: LITHIUM No results found for: VALPROATE No components found for:  CBMZ  Current Medications: Current Outpatient Medications  Medication Sig Dispense Refill  . fluticasone (FLONASE) 50 MCG/ACT nasal spray Place 2 sprays into both nostrils daily for 10 days. 16 g 0  . lamoTRIgine (LAMICTAL) 25 MG tablet Take one tab daily for 1 week and than 2 tab daily 60 tablet 0  . sertraline (ZOLOFT) 50 MG tablet Take 1/2 tab daily for 1 week and than full tab daily 30 tablet 0   No current facility-administered medications for this visit.      Musculoskeletal: Strength & Muscle Tone: within normal limits Gait & Station: normal Patient leans: N/A  Psychiatric Specialty Exam: Review of Systems  Skin: Negative for itching and rash.  Psychiatric/Behavioral: The patient is nervous/anxious.     Blood pressure 98/68, pulse 88, height 5\' 1"  (1.549 m), weight 108 lb (49 kg), SpO2 100 %.There is no height or weight on file to calculate BMI.  General Appearance: Casual and Emotional and tearful  Eye Contact:  Good  Speech:  Clear and Coherent  Volume:  Normal  Mood:  Anxious  Affect:  Congruent  Thought Process:  Goal Directed  Orientation:  Full (Time, Place, and Person)  Thought Content: Logical   Suicidal Thoughts:  No  Homicidal Thoughts:  No  Memory:  Immediate;   Good Recent;   Good Remote;   Good  Judgement:  Good  Insight:  Good  Psychomotor Activity:  Normal  Concentration:  Concentration: Good and Attention Span: Good  Recall:  Good  Fund of Knowledge: Good  Language: Good  Akathisia:  No  Handed:  Right  AIMS (if indicated): not done  Assets:  Communication Skills Desire for Improvement Housing Social Support Talents/Skills  ADL's:  Intact  Cognition: WNL  Sleep:  Fair   Screenings: GAD-7     Office Visit from 11/21/2017 in Primary Care at  [place] Office Visit from 11/02/2017 in Primary Care at [facility]  Total GAD-7 Score  17  16    PHQ2-9     Office Visit from 11/21/2017 in Primary Care at [facility] Visit from 11/02/2017 in Primary Care at [facility] Visit from 05/25/2017 in Primary Care at [facility] Total Score  4  4  4   PHQ-9 Total Score  20  19  17        Assessment and Plan: Bipolar disorder type I.  Generalized anxiety disorder.  Cannabis abuse.  Patient doing better since she started Lamictal and Zoloft.  However she still have episodic mood swings and she still feel anxious and nervous.  Recommended to try Lamictal 100 mg daily.  We received records from [facility].  She used to take Lamictal 150 mg daily in the past.  She also tried Abilify but it is unclear why it was discontinued.  I also encouraged to see a therapist and patient agreed to see a counselor in this office.  We will schedule appointment to see a therapist in this office.  [facility] as patient used to see Dr. Renato Gailseed and she had blood work done.  Recommended to call us back.  Reminded that if Lamictal causes a rash then she need to call us immediately.  I will see her again in 6 weeks.   Cleotis NipperSyed T Zyonna Vardaman, MD 02/13/2018, 12:35 PM

## 2018-02-16 ENCOUNTER — Ambulatory Visit (INDEPENDENT_AMBULATORY_CARE_PROVIDER_SITE_OTHER): Payer: 59 | Admitting: Physician Assistant

## 2018-02-16 ENCOUNTER — Other Ambulatory Visit: Payer: Self-pay

## 2018-02-16 ENCOUNTER — Encounter: Payer: Self-pay | Admitting: Physician Assistant

## 2018-02-16 ENCOUNTER — Telehealth: Payer: Self-pay

## 2018-02-16 VITALS — BP 94/62 | HR 84 | Temp 98.0°F | Resp 18 | Ht 63.5 in | Wt 108.6 lb

## 2018-02-16 DIAGNOSIS — D649 Anemia, unspecified: Secondary | ICD-10-CM | POA: Diagnosis not present

## 2018-02-16 DIAGNOSIS — Z789 Other specified health status: Secondary | ICD-10-CM

## 2018-02-16 DIAGNOSIS — IMO0001 Reserved for inherently not codable concepts without codable children: Secondary | ICD-10-CM

## 2018-02-16 DIAGNOSIS — Z114 Encounter for screening for human immunodeficiency virus [HIV]: Secondary | ICD-10-CM

## 2018-02-16 DIAGNOSIS — Z23 Encounter for immunization: Secondary | ICD-10-CM

## 2018-02-16 DIAGNOSIS — I959 Hypotension, unspecified: Secondary | ICD-10-CM

## 2018-02-16 LAB — POCT CBC
Granulocyte percent: 51.1 %G (ref 37–80)
HCT, POC: 37.5 % — AB (ref 37.7–47.9)
Hemoglobin: 11.8 g/dL — AB (ref 12.2–16.2)
Lymph, poc: 2.8 (ref 0.6–3.4)
MCH, POC: 21.6 pg — AB (ref 27–31.2)
MCHC: 31.5 g/dL — AB (ref 31.8–35.4)
MCV: 68.5 fL — AB (ref 80–97)
MID (cbc): 0.2 (ref 0–0.9)
MPV: 9.2 fL (ref 0–99.8)
POC Granulocyte: 3.2 (ref 2–6.9)
POC LYMPH PERCENT: 45.7 %L (ref 10–50)
POC MID %: 3.2 %M (ref 0–12)
Platelet Count, POC: 250 10*3/uL (ref 142–424)
RBC: 5.48 M/uL (ref 4.04–5.48)
RDW, POC: 15.1 %
WBC: 6.2 10*3/uL (ref 4.6–10.2)

## 2018-02-16 LAB — POCT URINE PREGNANCY: Preg Test, Ur: NEGATIVE

## 2018-02-16 MED ORDER — MEDROXYPROGESTERONE ACETATE 150 MG/ML IM SUSP
150.0000 mg | Freq: Once | INTRAMUSCULAR | Status: AC
Start: 2018-02-16 — End: 2018-02-16
  Administered 2018-02-16: 150 mg via INTRAMUSCULAR

## 2018-02-16 NOTE — Progress Notes (Signed)
Calaya Gildner  MRN: [id number] DOB: 07/09/1996  Subjective:  Paula Ortiz is a 22 y.o. female seen in office today for a chief complaint of need depo injection. She is overdue for her injection. Last given depo injection was 11/02/17.Currently on menstrual cycle. Had unportected sex at very beginning of menstrual cycle but not in the past few days. She missed last depo injection because she and her boyfriend broke up but they recently got back together. In terms of low blood pressure, pt has baseline low bp and anemia. Does not drink much water. Does not take iron supplements. Will sometimes get dizzy if she stands up too quickly but not all the time. Today, denies dizziness, lightheadedness, SOB, fatigue, blurred vision, and heart palpitations.    Review of Systems  Constitutional: Negative for chills, diaphoresis and fever.  Gastrointestinal: Negative for abdominal pain, diarrhea, nausea and vomiting.  Neurological: Negative for headaches.      There are no active problems to display for this patient.   Current Outpatient Medications on File Prior to Visit  Medication Sig Dispense Refill  . lamoTRIgine (LAMICTAL) 100 MG tablet Take 1 tablet (100 mg total) by mouth daily. 30 tablet 1  . sertraline (ZOLOFT) 50 MG tablet Take 1 tablet (50 mg total) by mouth daily. 30 tablet 1  . fluticasone (FLONASE) 50 MCG/ACT nasal spray Place 2 sprays into both nostrils daily for 10 days. 16 g 0   No current facility-administered medications on file prior to visit.     Allergies  Allergen Reactions  . Ibuprofen Hives     Objective:  BP 94/62 (BP Location: Left Arm, Patient Position: Sitting, Cuff Size: Normal)   Pulse 84   Temp 98 F (36.7 C) (Oral)   Resp 18   Ht 5' 3.5" (1.613 m)   Wt 108 lb 9.6 oz (49.3 kg)   LMP 02/09/2018 (Approximate)   SpO2 98%   BMI 18.93 kg/m   Physical Exam  Constitutional: She is oriented to person, place, and time. She appears well-developed and  well-nourished.  HENT:  Head: Normocephalic and atraumatic.  Eyes: Conjunctivae are normal.  Neck: Normal range of motion.  Cardiovascular: Normal rate, regular rhythm, normal heart sounds and intact distal pulses.  Pulmonary/Chest: Effort normal and breath sounds normal. She has no wheezes. She has no rales.  Neurological: She is alert and oriented to person, place, and time.  Skin: Skin is warm and dry.  Psychiatric: She has a normal mood and affect.  Vitals reviewed.    BP Readings from Last 3 Encounters:  02/16/18 94/62  01/25/18 90/65  11/21/17 100/64   Orthostatic VS for the past 24 hrs:  BP- Lying Pulse- Lying BP- Sitting Pulse- Sitting BP- Standing at 0 minutes Pulse- Standing at 0 minutes  02/16/18 1116 96/61 76 (!) 89/61 84 (!) 89/63 87    Results for orders placed or performed in visit on 02/16/18 (from the past 24 hour(s))  POCT urine pregnancy     Status: None   Collection Time: 02/16/18 11:07 AM  Result Value Ref Range   Preg Test, Ur Negative Negative    Results for orders placed or performed in visit on 02/16/18 (from the past 24 hour(s))  POCT urine pregnancy     Status: None   Collection Time: 02/16/18 11:07 AM  Result Value Ref Range   Preg Test, Ur Negative Negative  POCT CBC     Status: Abnormal   Collection Time: 02/16/18 11:55 AM  Result  Value Ref Range   WBC 6.2 4.6 - 10.2 K/uL   Lymph, poc 2.8 0.6 - 3.4   POC LYMPH PERCENT 45.7 10 - 50 %L   MID (cbc) 0.2 0 - 0.9   POC MID % 3.2 0 - 12 %M   POC Granulocyte 3.2 2 - 6.9   Granulocyte percent 51.1 37 - 80 %G   RBC 5.48 4.04 - 5.48 M/uL   Hemoglobin 11.8 (A) 12.2 - 16.2 g/dL   HCT, POC 16.137.5 (A) 09.637.7 - 47.9 %   MCV 68.5 (A) 80 - 97 fL   MCH, POC 21.6 (A) 27 - 31.2 pg   MCHC 31.5 (A) 31.8 - 35.4 g/dL   RDW, POC 04.515.1 %   Platelet Count, POC 250 142 - 424 K/uL   MPV 9.2 0 - 99.8 fL     Assessment and Plan :  1. Contraception Pt currently on menstural cycle, POC preg test negative. Low risk of  pt being pregnant. Explained risks vs benefits of depo injfection. She agrees to have injection. Recommend back up contraception for next 7 days. Return in scheduled time frame for next injection. Pt agrees to do so.  - POCT urine pregnancy - medroxyPROGESTERone (DEPO-PROVERA) injection 150 mg  2. Hypotension, unspecified hypotension type Given 8 oz cup of water to drink in office and bp improved. She is asx. Rec increase water consumption to at least 64 oz daily.  - Orthostatic vital signs - POCT CBC  3. Screening for HIV (human immunodeficiency virus) - HIV antibody  4. Anemia, unspecified type Rec repeating CBC when pt is off menstrual cycle.    Side effects, risks, benefits, and alternatives of the medications and treatment plan prescribed today were discussed, and patient expressed understanding of the instructions given. No barriers to understanding were identified. Red flags discussed in detail. Pt expressed understanding regarding what to do in case of emergency/urgent symptoms.  Benjiman CoreBrittany Wiseman PA-C  [facility] at [facility]  [facility] 02/16/2018 11:22 AM

## 2018-02-16 NOTE — Patient Instructions (Addendum)
Start drinking at least 64 oz of water a day. This will help with your low blood pressure. If you start to get lightheaded,dizzy, short of breath, or heart beating very fast, please seek care immediately.  For depo. Please return between August 29-Sept 12th for next injection.   Hypotension As your heart beats, it forces blood through your body. This force is called blood pressure. If you have hypotension, you have low blood pressure. When your blood pressure is too low, you may not get enough blood to your brain. You may feel weak, feel light-headed, have a fast heartbeat, or even pass out (faint). Follow these instructions at home: Eating and drinking  Drink enough fluids to keep your pee (urine) clear or pale yellow.  Eat a healthy diet, and follow instructions from your doctor about eating or drinking restrictions. A healthy diet includes: ? Fresh fruits and vegetables. ? Whole grains. ? Low-fat (lean) meats. ? Low-fat dairy products.  Eat extra salt only as told. Do not add extra salt to your diet unless your doctor tells you to.  Eat small meals often.  Avoid standing up quickly after you eat. Medicines  Take over-the-counter and prescription medicines only as told by your doctor. ? Follow instructions from your doctor about changing how much you take (the dosage) of your medicines, if this applies. ? Do not stop or change your medicine on your own. General instructions  Wear compression stockings as told by your doctor.  Get up slowly from lying down or sitting.  Avoid hot showers and a lot of heat as told by your doctor.  Return to your normal activities as told by your doctor. Ask what activities are safe for you.  Do not use any products that contain nicotine or tobacco, such as cigarettes and e-cigarettes. If you need help quitting, ask your doctor.  Keep all follow-up visits as told by your doctor. This is important. Contact a doctor if:  You throw up  (vomit).  You have watery poop (diarrhea).  You have a fever for more than 2-3 days.  You feel more thirsty than normal.  You feel weak and tired. Get help right away if:  You have chest pain.  You have a fast or irregular heartbeat.  You lose feeling (get numbness) in any part of your body.  You cannot move your arms or your legs.  You have trouble talking.  You get sweaty or feel light-headed.  You faint.  You have trouble breathing.  You have trouble staying awake.  You feel confused. This information is not intended to replace advice given to you by your health care provider. Make sure you discuss any questions you have with your health care provider. Document Released: 11/17/2009 Document Revised: 05/11/2016 Document Reviewed: 05/11/2016 Elsevier Interactive Patient Education  2017 [employer]   IF you received an x-ray today, you will receive an invoice from [facility]. Please contact [facility] at [phone] with questions or concerns regarding your invoice.   IF you received labwork today, you will receive an invoice from [facility]. Please contact [facility] at [phone] with questions or concerns regarding your invoice.   Our billing staff will not be able to assist you with questions regarding bills from these companies.  You will be contacted with the lab results as soon as they are available. The fastest way to get your results is to activate your My Chart account. Instructions are located on the last page of this paperwork. If you  have not heard from [place] regarding the results in 2 weeks, please contact this office.

## 2018-02-16 NOTE — Telephone Encounter (Signed)
-----   Message from Magdalene RiverBrittany D Wiseman, PA-C sent at 02/16/2018  1:24 PM EDT ----- Could you please call patient and report that her blood count does show that she is mildly anemic.  We will need to repeat her CBC when she is off her menstrual cycle.  Also,  please remind patient that she needs to use back-up contraceptive method such as condoms for the next 7 days.

## 2018-02-17 LAB — HIV ANTIBODY (ROUTINE TESTING W REFLEX): HIV Screen 4th Generation wRfx: NONREACTIVE

## 2018-02-20 NOTE — Telephone Encounter (Signed)
Left a detailed message per release

## 2018-02-24 ENCOUNTER — Ambulatory Visit (INDEPENDENT_AMBULATORY_CARE_PROVIDER_SITE_OTHER): Payer: 59 | Admitting: Licensed Clinical Social Worker

## 2018-02-24 ENCOUNTER — Encounter (HOSPITAL_COMMUNITY): Payer: Self-pay | Admitting: Licensed Clinical Social Worker

## 2018-02-24 DIAGNOSIS — F411 Generalized anxiety disorder: Secondary | ICD-10-CM | POA: Diagnosis not present
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
## 2018-03-31 ENCOUNTER — Ambulatory Visit (HOSPITAL_COMMUNITY): Payer: Self-pay | Admitting: Psychiatry

## 2018-04-03 ENCOUNTER — Ambulatory Visit (HOSPITAL_COMMUNITY): Payer: Self-pay | Admitting: Licensed Clinical Social Worker

## 2018-04-25 ENCOUNTER — Other Ambulatory Visit (HOSPITAL_COMMUNITY): Payer: Self-pay

## 2018-04-25 DIAGNOSIS — F319 Bipolar disorder, unspecified: Secondary | ICD-10-CM

## 2018-04-25 DIAGNOSIS — F411 Generalized anxiety disorder: Secondary | ICD-10-CM

## 2018-04-25 MED ORDER — SERTRALINE HCL 50 MG PO TABS: 50.0000 mg | ORAL_TABLET | Freq: Every day | ORAL | 1 refills | Status: DC

## 2018-04-25 MED ORDER — LAMOTRIGINE 100 MG PO TABS: 100.0000 mg | ORAL_TABLET | Freq: Every day | ORAL | 1 refills | Status: DC

## 2018-05-08 ENCOUNTER — Encounter

## 2018-05-08 ENCOUNTER — Ambulatory Visit (HOSPITAL_COMMUNITY): Payer: Self-pay | Admitting: Licensed Clinical Social Worker

## 2018-05-15 ENCOUNTER — Ambulatory Visit (INDEPENDENT_AMBULATORY_CARE_PROVIDER_SITE_OTHER): Payer: 59 | Admitting: Physician Assistant

## 2018-05-15 DIAGNOSIS — Z3042 Encounter for surveillance of injectable contraceptive: Secondary | ICD-10-CM | POA: Diagnosis not present

## 2018-05-15 DIAGNOSIS — IMO0001 Reserved for inherently not codable concepts without codable children: Secondary | ICD-10-CM

## 2018-05-15 DIAGNOSIS — Z3040 Encounter for surveillance of contraceptives, unspecified: Secondary | ICD-10-CM

## 2018-05-15 NOTE — Progress Notes (Signed)
Pt is here for her routine Depo-Provera inj. Pt is within her scheduled window. Pt receive inj in her right deltoid. Pt tolerated inj well with no signs or indications of adverse reactions. Pt was let go

## 2018-05-16 ENCOUNTER — Ambulatory Visit (INDEPENDENT_AMBULATORY_CARE_PROVIDER_SITE_OTHER): Payer: 59 | Admitting: Licensed Clinical Social Worker

## 2018-05-16 ENCOUNTER — Encounter

## 2018-05-16 DIAGNOSIS — F319 Bipolar disorder, unspecified: Secondary | ICD-10-CM

## 2018-05-16 DIAGNOSIS — F411 Generalized anxiety disorder: Secondary | ICD-10-CM

## 2018-05-17 ENCOUNTER — Encounter (HOSPITAL_COMMUNITY): Payer: Self-pay | Admitting: Licensed Clinical Social Worker

## 2018-05-17 NOTE — Progress Notes (Signed)
   THERAPIST PROGRESS NOTE  Session Time: 10-11  Participation Level: Active  Behavioral Response: Casual and Well GroomedAlertDepressed  Type of Therapy: Individual Therapy  Treatment Goals addressed: Anxiety Treatment Goals recorded.  Interventions: CBT  Summary: Paula Ortiz is a 22 y.o. female who presents with previous dx of Bipolar 1.   Subjective: "I try so hard to help my family be closer but it's like they don't want it. It's so painful, I don't know what to do w/ it."  Pt is engaged, polite, makes strong eye contact, and is labile throughout--laughs and cries easily in session. She states she has quit her job and is focusing on her 3 college classes at [employer] She reports on a recent family camping trip in which she felt her family was "dumping on her" by criticisizing her and complaining often. Pt reports highly dysfunctional relationship dynamics among her 4 siblings. Counselor spends time joining w/ pt, getting to know her long story of feeling "other than" and that "something is wrong w/ me". Pt identifies strongly rooted negative core beliefs and becomes tearful easily when she discusses her poor relationships w/ her younger sisters. Pt expresses strong regret for leaving the house when she was 18 after an argument w/ her parents. Pt reports she is excited about recently moving in w/ a roommate who is supportive and healthy since this will be the first time pt is living alone in a supportive environment and feels stable. She hopes to assert her own adulthood and begin to practice healthy routines of yoga, school, and part time work.   Suicidal/Homicidal: Nowithout intent/plan  Therapist Response: Counselor used open questions, active listening, encouragement, reflection of emotions. Counselor spent time building rapport and establishing working relationship. Counselor and pt discussed treatment plan, goals, and documented and signed treatment plan. Pt appears motivated, extremely  emotionally reactive, and lacks self esteem and self compassion. Pt states she primarily wants to work on gaining insight into her bx patterns and emotional triggers of family.  Plan: Return again in 2 weeks. Address goals of insight and changing negative core beliefs.  Diagnosis:    ICD-10-CM   1. GAD (generalized anxiety disorder) F41.1   2. Bipolar I disorder [facility]) F31.9        Margo Common, LCAS-A 05/17/2018

## 2018-06-14 ENCOUNTER — Encounter (HOSPITAL_COMMUNITY): Payer: Self-pay | Admitting: Licensed Clinical Social Worker

## 2018-06-14 ENCOUNTER — Ambulatory Visit (INDEPENDENT_AMBULATORY_CARE_PROVIDER_SITE_OTHER): Payer: 59 | Admitting: Licensed Clinical Social Worker

## 2018-06-14 DIAGNOSIS — F319 Bipolar disorder, unspecified: Secondary | ICD-10-CM

## 2018-06-14 DIAGNOSIS — F411 Generalized anxiety disorder: Secondary | ICD-10-CM | POA: Diagnosis not present

## 2018-06-14 NOTE — Progress Notes (Signed)
   THERAPIST PROGRESS NOTE  Session Time: 2-3  Participation Level: Active  Behavioral Response: Casual and Well GroomedAlertEuthymic  Type of Therapy: Individual Therapy  Treatment Goals addressed: Anxiety  Interventions: CBT and Solution Focused  Summary: Laloni Rowton is a 22 y.o. female who presents with Bipolar 1 and GAD w/ hx of Alcohol Use Disorder, Severe currently in remission.  "I'm bad at self accountability. I procrastinate w/ school work."  Pt is active, engaged, laughs inappropriately, w/ blunted tearfulness in session. She states she is mostly good. Mood has been stable last few weeks. She continues to stay sober. Pt states she wants to work on her organization and accountability skills for not procrastinating and completing tasks on time. Counselor and pt discuss methods for increasing her motivation to achieve her goals including measurability and setting clear, short term goals.     Suicidal/Homicidal: Nowithout intent/plan  Therapist Response: Counselor used open question, active listening, and exploration of pt's complaints. Counselor helped pt gain insight into her problematic study skills and procrastination. Pt appears motivated by creativity and self indulgent tasks such as movie watching or spending time w/ cat. Pt admits she does not like to do things that she feels she is not great at.   Plan: Return again in 2 weeks.  Diagnosis:    ICD-10-CM   1. GAD (generalized anxiety disorder) F41.1   2. Bipolar I disorder [facility]) F31.9        Margo Common, LCAS-A 06/14/2018

## 2018-06-16 ENCOUNTER — Ambulatory Visit (HOSPITAL_COMMUNITY): Payer: Self-pay | Admitting: Psychiatry

## 2018-07-11 ENCOUNTER — Other Ambulatory Visit (HOSPITAL_COMMUNITY): Payer: Self-pay

## 2018-07-11 DIAGNOSIS — F319 Bipolar disorder, unspecified: Secondary | ICD-10-CM

## 2018-07-11 DIAGNOSIS — F411 Generalized anxiety disorder: Secondary | ICD-10-CM

## 2018-07-11 MED ORDER — SERTRALINE HCL 50 MG PO TABS: 50.0000 mg | ORAL_TABLET | Freq: Every day | ORAL | 0 refills | Status: DC

## 2018-07-11 MED ORDER — LAMOTRIGINE 100 MG PO TABS: 100.0000 mg | ORAL_TABLET | Freq: Every day | ORAL | 0 refills | Status: DC

## 2018-07-12 MED FILL — lamoTRIgine 100 MG TABS: 100 | 30 days supply | Qty: 30 | Fill #0

## 2018-07-12 MED FILL — SERTRALINE HCL 50 MG TABLET: 50 | 30 days supply | Qty: 30 | Fill #0

## 2018-07-17 ENCOUNTER — Encounter (HOSPITAL_COMMUNITY): Payer: Self-pay | Admitting: Psychiatry

## 2018-07-17 ENCOUNTER — Ambulatory Visit (INDEPENDENT_AMBULATORY_CARE_PROVIDER_SITE_OTHER): Payer: 59 | Admitting: Psychiatry

## 2018-07-17 ENCOUNTER — Encounter

## 2018-07-17 DIAGNOSIS — F411 Generalized anxiety disorder: Secondary | ICD-10-CM

## 2018-07-17 DIAGNOSIS — F319 Bipolar disorder, unspecified: Secondary | ICD-10-CM

## 2018-07-17 MED ORDER — SERTRALINE HCL 50 MG PO TABS: 50.0000 mg | ORAL_TABLET | Freq: Every day | ORAL | 0 refills | Status: DC

## 2018-07-17 MED ORDER — LAMOTRIGINE 100 MG PO TABS: 100.0000 mg | ORAL_TABLET | Freq: Every day | ORAL | 0 refills | Status: DC

## 2018-07-17 NOTE — Progress Notes (Signed)
BH MD/PA/NP OP Progress Note  07/17/2018 2:47 PM Paula Ortiz  MRN:  [id number]  Chief Complaint: I am doing good.  I am taking my medication.  HPI: Paula Ortiz came for her follow-up appointment.  She is taking her medication and denies any major episodes of severe mood swings.  She is back to her old boyfriend and things are going well.  She also seeing Dimas Chyle for therapy.  She is going to school at [employer] and hoping to finish her school next year.  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  She denies any mania, psychosis, hallucination.  She has no rash, itching or tremors.  Her sleep is good.  She denies any feeling of hopelessness or worthlessness.  Her anxiety is much controlled on Zoloft.  She denies any panic attacks.  She feels proud that she has been not drinking since August 2018.  She is active and social.  She had a good support from her parents.  She lives with her parents and sibling.  She wants to continue her current medication.  Visit Diagnosis:    ICD-10-CM   1. GAD (generalized anxiety disorder) F41.1 sertraline (ZOLOFT) 50 MG tablet  2. Bipolar I disorder (HCC) F31.9 lamoTRIgine (LAMICTAL) 100 MG tablet    Past Psychiatric History: Reviewed. Patient saw Dr. Yetta Barre at [facility] since age 77.  She diagnosed with bipolar disorder.  She had a good response with Lamictal and Zoloft.  She also took Abilify.  Patient denies any history of psychiatric inpatient treatment or any suicidal attempt.  She had a history of smoking cocaine and cannabis but claims to be sober since August 2018.  Past Medical History:  Past Medical History:  Diagnosis Date  . Anxiety    No past surgical history on file.  Family Psychiatric History: Reviewed.  Family History:  Family History  Problem Relation Age of Onset  . Cancer Maternal Grandmother     Social History:  Social History    Socioeconomic History  . Marital status: Single    Spouse name: Not on file  . Number of children: 0  . Years of education: Not on file  . Highest education level: Bachelor's degree (e.g., BA, AB, BS)  Occupational History  . Not on file  Social Needs  . Financial resource strain: Not hard at all  . Food insecurity:    Worry: Never true    Inability: Never true  . Transportation needs:    Medical: No    Non-medical: No  Tobacco Use  . Smoking status: Former Smoker    Packs/day: 1.00    Last attempt to quit: 08/16/2017    Years since quitting: 0.9  . Smokeless tobacco: Never Used  Substance and Sexual Activity  . Alcohol use: Yes    Comment: very rare  . Drug use: Yes    Types: Marijuana    Comment: not really  . Sexual activity: Yes    Birth control/protection: Injection  Lifestyle  . Physical activity:    Days per week: 0 days    Minutes per session: 0 min  . Stress: Very much  Relationships  . Social connections:    Talks on phone: More than three times a week    Gets together: More than three times a week    Attends religious service: Never    Active member of  club or organization: No    Attends meetings of clubs or organizations: Never    Relationship status: Never married  Other Topics Concern  . Not on file  Social History Narrative  . Not on file    Allergies:  Allergies  Allergen Reactions  . Ibuprofen Hives    Metabolic Disorder Labs: No results found for: HGBA1C, MPG No results found for: PROLACTIN No results found for: CHOL, TRIG, HDL, CHOLHDL, VLDL, LDLCALC No results found for: TSH  Therapeutic Level Labs: No results found for: LITHIUM No results found for: VALPROATE No components found for:  CBMZ  Current Medications: Current Outpatient Medications  Medication Sig Dispense Refill  . fluticasone (FLONASE) 50 MCG/ACT nasal spray Place 2 sprays into both nostrils daily for 10 days. 16 g 0  . lamoTRIgine (LAMICTAL) 100 MG tablet Take  1 tablet (100 mg total) by mouth daily. 30 tablet 0  . sertraline (ZOLOFT) 50 MG tablet Take 1 tablet (50 mg total) by mouth daily. 30 tablet 0   No current facility-administered medications for this visit.      Musculoskeletal: Strength & Muscle Tone: within normal limits Gait & Station: normal Patient leans: N/A  Psychiatric Specialty Exam: ROS  Blood pressure 120/70, height 5\' 1"  (1.549 m), weight 115 lb (52.2 kg).There is no height or weight on file to calculate BMI.  General Appearance: Casual  Eye Contact:  Good  Speech:  Clear and Coherent  Volume:  Normal  Mood:  Euthymic  Affect:  Congruent  Thought Process:  Goal Directed  Orientation:  Full (Time, Place, and Person)  Thought Content: Logical   Suicidal Thoughts:  No  Homicidal Thoughts:  No  Memory:  Immediate;   Good Recent;   Good Remote;   Good  Judgement:  Good  Insight:  Good  Psychomotor Activity:  Normal  Concentration:  Concentration: Good and Attention Span: Good  Recall:  Good  Fund of Knowledge: Good  Language: Good  Akathisia:  No  Handed:  Right  AIMS (if indicated): not done  Assets:  Communication Skills Desire for Improvement Housing Resilience Social Support  ADL's:  Intact  Cognition: WNL  Sleep:  Good   Screenings: GAD-7     Counselor from 05/16/2018 in BEHAVIORAL HEALTH OUTPATIENT THERAPY [place] Office Visit from 11/21/2017 in Primary Care at [facility] Visit from 11/02/2017 in Primary Care at [facility]  Total GAD-7 Score  6  17  16     PHQ2-9     Counselor from 05/16/2018 in BEHAVIORAL HEALTH OUTPATIENT THERAPY [place] Office Visit from 11/21/2017 in Primary Care at [facility] Visit from 11/02/2017 in Primary Care at [facility] Visit from 05/25/2017 in Primary Care at [facility] Total Score  1  4  4  4   PHQ-9 Total Score  10  20  19  17        Assessment and Plan: Bipolar disorder type I.  Generalized anxiety disorder.  Patient is a stable on her current medication.   Encouraged to continue therapy with Dimas Chyle.  Continue Lamictal 100 mg daily and Zoloft 50 mg daily.  She has no rash, itching, tremors or shakes.  Recommended to call us back if she has any question or any concern.  Follow-up in 3 months.   Cleotis Nipper, MD 07/17/2018, 2:47 PM

## 2018-07-18 ENCOUNTER — Ambulatory Visit (INDEPENDENT_AMBULATORY_CARE_PROVIDER_SITE_OTHER): Payer: 59 | Admitting: Licensed Clinical Social Worker

## 2018-07-18 DIAGNOSIS — F411 Generalized anxiety disorder: Secondary | ICD-10-CM

## 2018-07-18 DIAGNOSIS — F319 Bipolar disorder, unspecified: Secondary | ICD-10-CM

## 2018-07-21 NOTE — Progress Notes (Signed)
Pt did not show for encounter

## 2018-08-14 ENCOUNTER — Ambulatory Visit: Payer: 59

## 2018-08-15 MED FILL — SERTRALINE HCL 50 MG TABLET: 50 | 90 days supply | Qty: 90 | Fill #0

## 2018-08-15 MED FILL — lamoTRIgine 100 MG TABS: 100 | 90 days supply | Qty: 90 | Fill #0

## 2018-08-19 ENCOUNTER — Other Ambulatory Visit: Payer: Self-pay | Admitting: *Deleted

## 2018-08-19 DIAGNOSIS — Z3042 Encounter for surveillance of injectable contraceptive: Secondary | ICD-10-CM

## 2018-08-19 NOTE — Addendum Note (Signed)
Addended by: Laurey ArrowGREER, Zayven Powe M on: 08/19/2018 09:25 AM   Modules accepted: Orders

## 2018-09-08 ENCOUNTER — Ambulatory Visit (INDEPENDENT_AMBULATORY_CARE_PROVIDER_SITE_OTHER): Payer: 59 | Admitting: Family Medicine

## 2018-09-08 ENCOUNTER — Telehealth: Payer: Self-pay | Admitting: General Practice

## 2018-09-08 ENCOUNTER — Telehealth: Payer: Self-pay

## 2018-09-08 DIAGNOSIS — Z3042 Encounter for surveillance of injectable contraceptive: Secondary | ICD-10-CM | POA: Diagnosis not present

## 2018-09-08 DIAGNOSIS — Z3040 Encounter for surveillance of contraceptives, unspecified: Secondary | ICD-10-CM

## 2018-09-08 LAB — POCT URINE PREGNANCY: Preg Test, Ur: NEGATIVE

## 2018-09-08 MED ORDER — MEDROXYPROGESTERONE ACETATE 150 MG/ML IM SUSP
150.0000 mg | Freq: Once | INTRAMUSCULAR | Status: AC
  Administered 2018-09-08: 150 mg via INTRAMUSCULAR

## 2018-09-08 NOTE — Telephone Encounter (Signed)
Copied from CRM [id number]. Topic: General - Other >> Sep 07, 2018  9:37 AM Elliot Gault wrote: Relation to pt: self  Call back number: [phone]   Reason for call:  Patient mis the window for her depo, patient states a pregnancy test is required please advise patient if a nurse visit, lab or appointment is needed with PCP so can start back up her depo, please advise patient directly.   Per Doyne Keel - pt has been notified

## 2018-09-08 NOTE — Telephone Encounter (Signed)
Pt called clinic regarding Depo-Provera.  Last day in window was Dec 9th.  Pt states her period started approx Dec 12-14th.  Pt confirmed repeatedly that she has not had unprotected sex since Depo window or since period.  Advised pt to return to clinic for pregnancy and Depo shot pending negative pregnancy.

## 2018-10-18 ENCOUNTER — Encounter (HOSPITAL_COMMUNITY): Payer: Self-pay | Admitting: Psychiatry

## 2018-10-18 ENCOUNTER — Ambulatory Visit (INDEPENDENT_AMBULATORY_CARE_PROVIDER_SITE_OTHER): Payer: 59 | Admitting: Psychiatry

## 2018-10-18 DIAGNOSIS — F411 Generalized anxiety disorder: Secondary | ICD-10-CM | POA: Diagnosis not present

## 2018-10-18 DIAGNOSIS — F319 Bipolar disorder, unspecified: Secondary | ICD-10-CM

## 2018-10-18 MED ORDER — SERTRALINE HCL 50 MG PO TABS: 50.0000 mg | ORAL_TABLET | Freq: Every day | ORAL | 0 refills | Status: DC

## 2018-10-18 MED ORDER — LAMOTRIGINE 150 MG PO TABS: 150.0000 mg | ORAL_TABLET | Freq: Every day | ORAL | 0 refills | Status: DC

## 2018-10-18 NOTE — Progress Notes (Signed)
BH MD/PA/NP OP Progress Note  10/18/2018 10:39 AM Paula Ortiz  MRN:  [id number]  Chief Complaint: I am doing fine but I still have some time irritability and manic symptoms.  HPI: Paula Ortiz in for her appointment.  She is taking Lamictal and Zoloft.  Her anxiety is good but she still struggle to control her mood swings and irritability.  She noticed having racing thoughts and manic-like symptoms when she gets agitated for no reason.  She admitted not seeing [name] on a regular basis.  She is busy at school and hoping to finish next year.  Her relationship with a [name] is going very well.  Since the back together there has been no issues.  She is sleeping okay.  She denies drinking or using any illegal substances.  She has no tremors shakes or any EPS.  She feels proud that she is not drinking since August 2018.  She is active and social.  She has good support from her parents.  Visit Diagnosis:    ICD-10-CM   1. GAD (generalized anxiety disorder) F41.1 sertraline (ZOLOFT) 50 MG tablet  2. Bipolar I disorder (HCC) F31.9 lamoTRIgine (LAMICTAL) 150 MG tablet    Past Psychiatric History: Viewed. H/O Bipolar since age 23. Saw Dr Yetta Barre at [facility]. Had a good response with Lamictal and Zoloft. She also took Abilify. No H/O inpatient treatment or any suicidal attempt. H/O cocaine, ETOH and cannabis use but claims to be sober since August 2018.  Past Medical History:  Past Medical History:  Diagnosis Date  . Anxiety    No past surgical history on file.  Family Psychiatric History: Reviewed.  Family History:  Family History  Problem Relation Age of Onset  . Cancer Maternal Grandmother     Social History:  Social History   Socioeconomic History  . Marital status: Single    Spouse name: Not on file  . Number of children: 0  . Years of education: Not on file  . Highest education level: Bachelor's degree (e.g., BA, AB, BS)  Occupational History  . Not on file  Social Needs  .  Financial resource strain: Not hard at all  . Food insecurity:    Worry: Never true    Inability: Never true  . Transportation needs:    Medical: No    Non-medical: No  Tobacco Use  . Smoking status: Former Smoker    Packs/day: 1.00    Last attempt to quit: 08/16/2017    Years since quitting: 1.1  . Smokeless tobacco: Never Used  Substance and Sexual Activity  . Alcohol use: Yes    Comment: very rare  . Drug use: Yes    Types: Marijuana    Comment: not really  . Sexual activity: Yes    Birth control/protection: Injection  Lifestyle  . Physical activity:    Days per week: 0 days    Minutes per session: 0 min  . Stress: Very much  Relationships  . Social connections:    Talks on phone: More than three times a week    Gets together: More than three times a week    Attends religious service: Never    Active member of club or organization: No    Attends meetings of clubs or organizations: Never    Relationship status: Never married  Other Topics Concern  . Not on file  Social History Narrative  . Not on file    Allergies:  Allergies  Allergen Reactions  . Ibuprofen Hives  Metabolic Disorder Labs: No results found for: HGBA1C, MPG No results found for: PROLACTIN No results found for: CHOL, TRIG, HDL, CHOLHDL, VLDL, LDLCALC No results found for: TSH  Therapeutic Level Labs: No results found for: LITHIUM No results found for: VALPROATE No components found for:  CBMZ  Current Medications: Current Outpatient Medications  Medication Sig Dispense Refill  . lamoTRIgine (LAMICTAL) 100 MG tablet Take 1 tablet (100 mg total) by mouth daily. 90 tablet 0  . sertraline (ZOLOFT) 50 MG tablet Take 1 tablet (50 mg total) by mouth daily. 90 tablet 0   No current facility-administered medications for this visit.      Musculoskeletal: Strength & Muscle Tone: within normal limits Gait & Station: normal Patient leans: N/A  Psychiatric Specialty Exam: ROS  Blood  pressure 108/71, pulse 95, height 5\' 1"  (1.549 m), weight 123 lb (55.8 kg), SpO2 98 %.Body mass index is 23.24 kg/m.  General Appearance: Casual  Eye Contact:  Good  Speech:  Normal Rate  Volume:  Normal  Mood:  Euthymic  Affect:  Congruent  Thought Process:  Goal Directed  Orientation:  Full (Time, Place, and Person)  Thought Content: Logical   Suicidal Thoughts:  No  Homicidal Thoughts:  No  Memory:  Immediate;   Good Recent;   Good Remote;   Good  Judgement:  Good  Insight:  Good  Psychomotor Activity:  Normal  Concentration:  Concentration: Good and Attention Span: Good  Recall:  Good  Fund of Knowledge: Good  Language: Good  Akathisia:  No  Handed:  Right  AIMS (if indicated): not done  Assets:  Communication Skills Desire for Improvement Housing Resilience Social Support Talents/Skills  ADL's:  Intact  Cognition: WNL  Sleep:  Fair   Screenings: GAD-7     Counselor from 05/16/2018 in BEHAVIORAL HEALTH OUTPATIENT THERAPY [place] Office Visit from [facility]  Total GAD-7 Score  6  17  16     PHQ2-9     Counselor from 05/16/2018 in BEHAVIORAL HEALTH OUTPATIENT THERAPY [place] Office [facility]  PHQ-2 Total Score  1  4  4  4   PHQ-9 Total Score  10  20  19  17        Assessment and Plan: Bipolar disorder type I.  Generalized anxiety disorder.  Polysubstance dependence in complete remission.  Patient noticed irritability and manic-like symptoms.  I recommended to try increase Lamictal 150 mg to help those residual symptoms.  She agreed with the plan.  Continue Zoloft 50 mg daily.  Encouraged to [facility].  Discussed medication side effect specially rash with the Lamictal and in that case she need to stop the medication  immediately.  She feels proud that she is not using drugs since August 2018.  Recommended to call us back if she is any question or any concern.  Follow-up in 3 months.   Cleotis NipperSyed T Dorise Gangi, MD 10/18/2018, 10:39 AM

## 2018-10-19 ENCOUNTER — Encounter (HOSPITAL_COMMUNITY): Payer: Self-pay | Admitting: Emergency Medicine

## 2018-10-19 ENCOUNTER — Encounter: Payer: Self-pay | Admitting: Emergency Medicine

## 2018-10-19 ENCOUNTER — Ambulatory Visit (INDEPENDENT_AMBULATORY_CARE_PROVIDER_SITE_OTHER)
Admission: EM | Admit: 2018-10-19 | Discharge: 2018-10-19 | Disposition: A | Discharge status: Home / Self Care | Payer: 59 | Source: Home / Self Care | Attending: Family Medicine | Admitting: Family Medicine

## 2018-10-19 ENCOUNTER — Observation Stay (HOSPITAL_COMMUNITY)
Admission: EM | Admit: 2018-10-19 | Discharge: 2018-10-20 | Disposition: A | Discharge status: Home / Self Care | Payer: 59 | Attending: Internal Medicine | Admitting: Internal Medicine

## 2018-10-19 ENCOUNTER — Observation Stay (HOSPITAL_COMMUNITY): Payer: 59

## 2018-10-19 ENCOUNTER — Emergency Department (HOSPITAL_COMMUNITY): Payer: 59

## 2018-10-19 ENCOUNTER — Other Ambulatory Visit: Payer: Self-pay

## 2018-10-19 DIAGNOSIS — K810 Acute cholecystitis: Principal | ICD-10-CM | POA: Insufficient documentation

## 2018-10-19 DIAGNOSIS — R1010 Upper abdominal pain, unspecified: Secondary | ICD-10-CM | POA: Diagnosis not present

## 2018-10-19 DIAGNOSIS — K819 Cholecystitis, unspecified: Secondary | ICD-10-CM | POA: Diagnosis not present

## 2018-10-19 DIAGNOSIS — R69 Illness, unspecified: Principal | ICD-10-CM

## 2018-10-19 DIAGNOSIS — R1013 Epigastric pain: Secondary | ICD-10-CM | POA: Diagnosis not present

## 2018-10-19 DIAGNOSIS — R112 Nausea with vomiting, unspecified: Secondary | ICD-10-CM | POA: Diagnosis not present

## 2018-10-19 DIAGNOSIS — F319 Bipolar disorder, unspecified: Secondary | ICD-10-CM | POA: Insufficient documentation

## 2018-10-19 DIAGNOSIS — R05 Cough: Secondary | ICD-10-CM | POA: Diagnosis not present

## 2018-10-19 DIAGNOSIS — R0981 Nasal congestion: Secondary | ICD-10-CM

## 2018-10-19 DIAGNOSIS — J111 Influenza due to unidentified influenza virus with other respiratory manifestations: Secondary | ICD-10-CM

## 2018-10-19 DIAGNOSIS — K828 Other specified diseases of gallbladder: Secondary | ICD-10-CM | POA: Diagnosis not present

## 2018-10-19 DIAGNOSIS — R197 Diarrhea, unspecified: Secondary | ICD-10-CM | POA: Diagnosis not present

## 2018-10-19 DIAGNOSIS — R1084 Generalized abdominal pain: Secondary | ICD-10-CM | POA: Diagnosis not present

## 2018-10-19 DIAGNOSIS — R945 Abnormal results of liver function studies: Secondary | ICD-10-CM | POA: Diagnosis not present

## 2018-10-19 DIAGNOSIS — R109 Unspecified abdominal pain: Secondary | ICD-10-CM

## 2018-10-19 HISTORY — DX: Bipolar disorder, unspecified: F31.9

## 2018-10-19 LAB — URINALYSIS, ROUTINE W REFLEX MICROSCOPIC
Bilirubin Urine: NEGATIVE
Glucose, UA: NEGATIVE mg/dL
Hgb urine dipstick: NEGATIVE
KETONES UR: 5 mg/dL — AB
Leukocytes,Ua: NEGATIVE
Nitrite: NEGATIVE
PROTEIN: 30 mg/dL — AB
Specific Gravity, Urine: 1.025 (ref 1.005–1.030)
pH: 8 (ref 5.0–8.0)

## 2018-10-19 LAB — CBC
HCT: 40.2 % (ref 36.0–46.0)
Hemoglobin: 12.5 g/dL (ref 12.0–15.0)
MCH: 22 pg — ABNORMAL LOW (ref 26.0–34.0)
MCHC: 31.1 g/dL (ref 30.0–36.0)
MCV: 70.9 fL — ABNORMAL LOW (ref 80.0–100.0)
Platelets: 204 10*3/uL (ref 150–400)
RBC: 5.67 MIL/uL — ABNORMAL HIGH (ref 3.87–5.11)
RDW: 14 % (ref 11.5–15.5)
WBC: 14.5 10*3/uL — ABNORMAL HIGH (ref 4.0–10.5)
nRBC: 0 % (ref 0.0–0.2)

## 2018-10-19 LAB — COMPREHENSIVE METABOLIC PANEL
ALT: 513 U/L — ABNORMAL HIGH (ref 0–44)
AST: 832 U/L — ABNORMAL HIGH (ref 15–41)
Albumin: 5.1 g/dL — ABNORMAL HIGH (ref 3.5–5.0)
Alkaline Phosphatase: 121 U/L (ref 38–126)
Anion gap: 10 (ref 5–15)
BUN: 9 mg/dL (ref 6–20)
CO2: 22 mmol/L (ref 22–32)
Calcium: 9.7 mg/dL (ref 8.9–10.3)
Chloride: 106 mmol/L (ref 98–111)
Creatinine, Ser: 0.59 mg/dL (ref 0.44–1.00)
GFR calc Af Amer: >60 mL/min (ref >60)
GFR calc non Af Amer: >60 mL/min (ref >60)
Glucose, Bld: 145 mg/dL — ABNORMAL HIGH (ref 70–99)
Potassium: 3.5 mmol/L (ref 3.5–5.1)
SODIUM: 138 mmol/L (ref 135–145)
Total Bilirubin: 2.6 mg/dL — ABNORMAL HIGH (ref 0.3–1.2)
Total Protein: 8.2 g/dL — ABNORMAL HIGH (ref 6.5–8.1)

## 2018-10-19 LAB — I-STAT BETA HCG BLOOD, ED (MC, WL, AP ONLY)

## 2018-10-19 LAB — LIPASE, BLOOD: Lipase: 29 U/L (ref 11–51)

## 2018-10-19 MED ORDER — DICYCLOMINE HCL 20 MG PO TABS: 20.0000 mg | ORAL_TABLET | Freq: Three times a day (TID) | ORAL | 0 refills | Status: DC

## 2018-10-19 MED ORDER — FAMOTIDINE IN NACL 20-0.9 MG/50ML-% IV SOLN
20.0000 mg | Freq: Two times a day (BID) | INTRAVENOUS | Status: DC
  Administered 2018-10-19: 20 mg via INTRAVENOUS
  Filled 2018-10-19 (×2): qty 50

## 2018-10-19 MED ORDER — SODIUM CHLORIDE 0.9 % IV SOLN: 2.0000 g | INTRAVENOUS | Status: DC

## 2018-10-19 MED ORDER — KCL-LACTATED RINGERS-D5W 20 MEQ/L IV SOLN
INTRAVENOUS | Status: DC
  Administered 2018-10-19: 19:00:00 via INTRAVENOUS
  Filled 2018-10-19 (×3): qty 1000

## 2018-10-19 MED ORDER — CETIRIZINE-PSEUDOEPHEDRINE ER 5-120 MG PO TB12: 1.0000 | ORAL_TABLET | Freq: Every day | ORAL | 0 refills | Status: DC

## 2018-10-19 MED ORDER — HYDROMORPHONE HCL 1 MG/ML IJ SOLN
0.5000 mg | Freq: Once | INTRAMUSCULAR | Status: AC
  Administered 2018-10-19: 0.5 mg via INTRAVENOUS
  Filled 2018-10-19: qty 1

## 2018-10-19 MED ORDER — TECHNETIUM TC 99M MEBROFENIN IV KIT
7.7000 | PACK | Freq: Once | INTRAVENOUS | Status: AC | PRN
  Administered 2018-10-19: 7.7 via INTRAVENOUS

## 2018-10-19 MED ORDER — ENOXAPARIN SODIUM 40 MG/0.4ML ~~LOC~~ SOLN: 40.0000 mg | SUBCUTANEOUS | Status: DC

## 2018-10-19 MED ORDER — SODIUM CHLORIDE 0.9 % IV SOLN
INTRAVENOUS | Status: AC
  Administered 2018-10-19: 13:00:00 via INTRAVENOUS

## 2018-10-19 MED ORDER — SODIUM CHLORIDE 0.9 % IV SOLN
1.0000 g | Freq: Once | INTRAVENOUS | Status: AC
  Administered 2018-10-19: 1 g via INTRAVENOUS
  Filled 2018-10-19: qty 10

## 2018-10-19 MED ORDER — MORPHINE SULFATE (PF) 2 MG/ML IV SOLN
1.0000 mg | INTRAVENOUS | Status: DC | PRN
  Administered 2018-10-19: 1 mg via INTRAVENOUS
  Filled 2018-10-19: qty 1

## 2018-10-19 MED ORDER — ONDANSETRON 4 MG PO TBDP
4.0000 mg | ORAL_TABLET | Freq: Once | ORAL | Status: AC
  Administered 2018-10-19: 4 mg via ORAL

## 2018-10-19 MED ORDER — ONDANSETRON 4 MG PO TBDP: 4.0000 mg | ORAL_TABLET | Freq: Three times a day (TID) | ORAL | 0 refills | Status: DC | PRN

## 2018-10-19 MED ORDER — BENZONATATE 200 MG PO CAPS: 200.0000 mg | ORAL_CAPSULE | Freq: Three times a day (TID) | ORAL | 0 refills | Status: DC | PRN

## 2018-10-19 MED ORDER — METOCLOPRAMIDE HCL 5 MG/ML IJ SOLN
10.0000 mg | Freq: Once | INTRAMUSCULAR | Status: AC
  Administered 2018-10-19: 10 mg via INTRAMUSCULAR

## 2018-10-19 MED ORDER — SODIUM CHLORIDE 0.9% FLUSH: 3.0000 mL | Freq: Once | INTRAVENOUS | Status: DC

## 2018-10-19 MED ORDER — ONDANSETRON HCL 4 MG/2ML IJ SOLN
4.0000 mg | Freq: Once | INTRAMUSCULAR | Status: AC
  Administered 2018-10-19: 4 mg via INTRAVENOUS
  Filled 2018-10-19: qty 2

## 2018-10-19 NOTE — Consult Note (Signed)
Reason for Consult:  Abdominal pain, nausea, vomiting, elevated LFT's [facility]  Paula Ortiz is an 23 y.o. female.  HPI:  Patient is a 23 year old female who presented to urgent care this morning with complaints of cough, congestion starting a couple days ago.  Last night she started having nausea and vomiting, along with diarrhea, then abdominal pain.  He boyfriend has also had a URI, she thought she got it from him.  They have not been traveling, and not eaten at any exotic foods.  She is a Consulting civil engineerstudent here in town.   She continue to be diaphoretic with nausea and vomiting in the Urgent Care office.    She was sent to the ED from the urgent care center.  Work-up in the ED/urgent care shows she is afebrile vital signs are stable. Labs shows a glucose of 145 electrolytes are normal.  Albumin is 5.1 lipase is 29, AST 832, ALT 513, total bilirubin 2.6.  WBC 14.5, hemoglobin 12.5, hematocrit 40.2, platelets 204,000.  Urinalysis is clear and unremarkable hCG is less than 5.0.  Abdominal ultrasound: There is no cholelithiasis noted.  No sonographic Eulah PontMurphy sign however there is severe wall thickening all along 1 side of the gallbladder measuring 7 mm with possible pericholecystic fluid.  The common bile duct is 3 mm.  The liver showed no focal lesions the IVC was normal.  It was radiology's opinion these findings were concerning for acute cholecystitis but not definitive.  We are asked to see.  Past Medical History:  Diagnosis Date  . Anxiety   . Bipolar disorder (HCC) 10/19/2018    History reviewed. No pertinent surgical history.  Family History  Problem Relation Age of Onset  . Cancer Maternal Grandmother     Social History:  reports that she quit smoking about 14 months ago. She smoked 1.00 pack per day. She has never used smokeless tobacco. She reports current alcohol use. She reports current drug use. Drug: Marijuana.  Allergies:  Allergies  Allergen Reactions  . Ibuprofen  Hives   Prior to Admission medications   Medication Sig Start Date End Date Taking? Authorizing Provider  benzonatate (TESSALON) 200 MG capsule Take 1 capsule (200 mg total) by mouth 3 (three) times daily as needed for up to 7 days for cough. 10/19/18 10/26/18  Wieters, Hallie C, PA-C  cetirizine-pseudoephedrine (ZYRTEC-D) 5-120 MG tablet Take 1 tablet by mouth daily. 10/19/18   Wieters, Hallie C, PA-C  dicyclomine (BENTYL) 20 MG tablet Take 1 tablet (20 mg total) by mouth 4 (four) times daily -  before meals and at bedtime. 10/19/18   Wieters, Hallie C, PA-C  lamoTRIgine (LAMICTAL) 150 MG tablet Take 1 tablet (150 mg total) by mouth daily. 10/18/18   Arfeen, Phillips GroutSyed T, MD  ondansetron (ZOFRAN-ODT) 4 MG disintegrating tablet Take 1 tablet (4 mg total) by mouth every 8 (eight) hours as needed for nausea or vomiting. 10/19/18   Wieters, Hallie C, PA-C  sertraline (ZOLOFT) 50 MG tablet Take 1 tablet (50 mg total) by mouth daily. 10/18/18   Cleotis NipperArfeen, Syed T, MD     Anti-infectives (From admission, onward)   Start     Dose/Rate Route Frequency Ordered Stop   10/19/18 1630  cefTRIAXone (ROCEPHIN) 1 g in sodium chloride 0.9 % 100 mL IVPB     1 g 200 mL/hr over 30 Minutes Intravenous  Once 10/19/18 1618        Results for orders placed or performed during the hospital encounter of 10/19/18 (  from the past 48 hour(s))  Lipase, blood     Status: None   Collection Time: 10/19/18  1:31 PM  Result Value Ref Range   Lipase 29 11 - 51 U/L    Comment: Performed at [facility], [address], [place], [place] [address]  Comprehensive metabolic panel     Status: Abnormal   Collection Time: 10/19/18  1:31 PM  Result Value Ref Range   Sodium 138 135 - 145 mmol/L   Potassium 3.5 3.5 - 5.1 mmol/L   Chloride 106 98 - 111 mmol/L   CO2 22 22 - 32 mmol/L   Glucose, Bld 145 (H) 70 - 99 mg/dL   BUN 9 6 - 20 mg/dL   Creatinine, Ser 1.95 0.44 - 1.00 mg/dL   Calcium 9.7 8.9 - 09.3 mg/dL   Total  Protein 8.2 (H) 6.5 - 8.1 g/dL   Albumin 5.1 (H) 3.5 - 5.0 g/dL   AST 267 (H) 15 - 41 U/L   ALT 513 (H) 0 - 44 U/L   Alkaline Phosphatase 121 38 - 126 U/L   Total Bilirubin 2.6 (H) 0.3 - 1.2 mg/dL   GFR calc non Af Amer >60 >60 mL/min   GFR calc Af Amer >60 >60 mL/min   Anion gap 10 5 - 15    Comment: Performed at [facility], [address] [place], [place] [address]  CBC     Status: Abnormal   Collection Time: 10/19/18  1:31 PM  Result Value Ref Range   WBC 14.5 (H) 4.0 - 10.5 K/uL   RBC 5.67 (H) 3.87 - 5.11 MIL/uL   Hemoglobin 12.5 12.0 - 15.0 g/dL   HCT 09.9 83.3 - 82.5 %   MCV 70.9 (L) 80.0 - 100.0 fL   MCH 22.0 (L) 26.0 - 34.0 pg   MCHC 31.1 30.0 - 36.0 g/dL   RDW 05.3 97.6 - 73.4 %   Platelets 204 150 - 400 K/uL   nRBC 0.0 0.0 - 0.2 %    Comment: Performed at [facility], [address], [place], [place] [address]  I-Stat beta hCG blood, ED     Status: None   Collection Time: 10/19/18  1:35 PM  Result Value Ref Range   I-stat hCG, quantitative <5.0 <5 mIU/mL   Comment 3            Comment:   GEST. AGE      CONC.  (mIU/mL)   <=1 WEEK        5 - 50     2 WEEKS       50 - 500     3 WEEKS       100 - 10,000     4 WEEKS     1,000 - 30,000        FEMALE AND NON-PREGNANT FEMALE:     LESS THAN 5 mIU/mL   Urinalysis, Routine w reflex microscopic     Status: Abnormal   Collection Time: 10/19/18  1:48 PM  Result Value Ref Range   Color, Urine AMBER (A) YELLOW    Comment: BIOCHEMICALS MAY BE AFFECTED BY COLOR   APPearance CLEAR CLEAR   Specific Gravity, Urine 1.025 1.005 - 1.030   pH 8.0 5.0 - 8.0   Glucose, UA NEGATIVE NEGATIVE mg/dL   Hgb urine dipstick NEGATIVE NEGATIVE   Bilirubin Urine NEGATIVE NEGATIVE   Ketones, ur 5 (A) NEGATIVE mg/dL   Protein, ur 30 (A)  NEGATIVE mg/dL   Nitrite NEGATIVE NEGATIVE   Leukocytes,Ua NEGATIVE NEGATIVE   RBC / HPF 0-5 0 - 5 RBC/hpf   WBC, UA 0-5 0 - 5 WBC/hpf   Bacteria, UA RARE (A) NONE SEEN    Squamous Epithelial / LPF 0-5 0 - 5   Mucus PRESENT     Comment: Performed at [facility], [address] [place], [place] [address]    US Abdomen Complete  Result Date: 10/19/2018 CLINICAL DATA:  Acute generalized abdominal pain. EXAM: ABDOMEN ULTRASOUND COMPLETE COMPARISON:  None. FINDINGS: Gallbladder: No cholelithiasis is noted. No sonographic Murphy's sign is noted. However, there is noted severe wall thickening along 1 side of the gallbladder, measuring 7 mm, with possible pericholecystic fluid. Common bile duct: Diameter: 3 mm which is within normal limits. Liver: No focal lesion identified. Within normal limits in parenchymal echogenicity. Portal vein is patent on color Doppler imaging with normal direction of blood flow towards the liver. IVC: No abnormality visualized. Pancreas: Visualized portion unremarkable. Spleen: Size and appearance within normal limits. Right Kidney: Length: 10.5 cm. Echogenicity within normal limits. No mass or hydronephrosis visualized. Left Kidney: Length: 11.4 cm. Echogenicity within normal limits. No mass or hydronephrosis visualized. Abdominal aorta: No aneurysm visualized. Other findings: None. IMPRESSION: Severe focal wall thickening is seen involving the gallbladder with possible pericholecystic fluid. No cholelithiasis or sonographic Murphy's sign is noted. These findings are concerning for possible acute cholecystitis; HIDA scan may be performed for further evaluation. No other abnormality seen in the abdomen. Electronically Signed   By: Lupita Raider, M.D.   On: 10/19/2018 15:17    Review of Systems  Constitutional: Negative.   HENT: Positive for congestion.   Eyes: Negative.   Respiratory: Positive for cough. Negative for hemoptysis, sputum production, shortness of breath and wheezing.   Cardiovascular: Negative.   Gastrointestinal: Positive for abdominal pain, diarrhea, nausea and vomiting. Negative for blood in stool,  constipation, heartburn and melena.  Genitourinary: Negative.   Musculoskeletal: Negative.   Skin: Negative.   Neurological: Negative.   Psychiatric/Behavioral: Negative.        Hx of bipolar disorder   Blood pressure 97/81, pulse 71, temperature 98.5 F (36.9 C), temperature source Oral, resp. rate 16, SpO2 100 %. Physical Exam  Constitutional: She is oriented to person, place, and time. She appears well-developed and well-nourished. No distress.  HENT:  Head: Normocephalic.  Left Ear: External ear normal.  Mouth/Throat: Oropharynx is clear and moist. No oropharyngeal exudate.  Eyes: Right eye exhibits no discharge. Left eye exhibits no discharge. No scleral icterus.  Pupils are equal  Neck: Normal range of motion. Neck supple. No JVD present. No tracheal deviation present. No thyromegaly present.  Cardiovascular: Normal rate, regular rhythm, normal heart sounds and intact distal pulses.  No murmur heard. Respiratory: No respiratory distress. She has no wheezes. She has no rales. She exhibits no tenderness.  GI: Soft. Bowel sounds are normal. She exhibits no distension and no mass. There is no abdominal tenderness. There is no rebound and no guarding.  Complains of midepigastric pain right now  Musculoskeletal:        General: No tenderness or edema.  Lymphadenopathy:    She has no cervical adenopathy.  Neurological: She is alert and oriented to person, place, and time. No cranial nerve deficit.  Skin: Skin is warm and dry. No rash noted. She is not diaphoretic. No erythema. No pallor.  Psychiatric: She has a normal mood and affect. Her  behavior is normal. Judgment and thought content normal.    Assessment/Plan: Abdominal pain, nausea and vomiting Elevated LFT's Abnormal Ultrasound URI Bipolar disorder  Plan:  This is not a typical gallbladder presentation.  We will let medicine admit and be sure she does not have some form of hepatitis.  No indication she has exposure, she  does have several jewelery piercing's.  We are getting a HIDA scan, will leave her on IV fluids and start some Rocephin.  If the HIDA scan is positive we can put her on the schedule for surgery tomorrow.  She is not really symptomatic right now aside from some ongoing discomfort mid abdomen, and some complaints of discomfort back on right side.  I will start some IV fluids, and if she is feeling better she can have some clears after the HIDA until MN.  Make her NPO after MN for possible surgery tomorrow.  Recheck labs in AM.  Sherrie GeorgeJENNINGS,Paula Ortiz 10/19/2018, 4:38 PM

## 2018-10-19 NOTE — ED Provider Notes (Signed)
[name]    CSN: [id number] Arrival date & time: 10/19/18  1024     History   Chief Complaint Chief Complaint  Patient presents with  . Emesis    HPI Paula Ortiz is a 23 y.o. female no significant past medical history presenting today for evaluation of URI symptoms, abdominal pain, vomiting.  Patient states that over the past 2 days she has had cough and congestion.  Beginning last night she began to have abdominal pain as well as persistent nausea and vomiting through the night.  She is also had associated diarrhea.  She states that her pain has persistent since and feels sharp.  Feels as if it is radiating to her back.  Denies previous surgeries on her abdomen.  Notes that pain is in her upper abdomen.  Denies blood in the vomit, but believes there may have been a small amount of blood in her stool.  She has been unable to keep down liquids and solids.  Denies any known fevers.  HPI  Past Medical History:  Diagnosis Date  . Anxiety     There are no active problems to display for this patient.   History reviewed. No pertinent surgical history.  OB History   No obstetric history on file.      Home Medications    Prior to Admission medications   Medication Sig Start Date End Date Taking? Authorizing Provider  lamoTRIgine (LAMICTAL) 150 MG tablet Take 1 tablet (150 mg total) by mouth daily. 10/18/18  Yes Arfeen, Phillips GroutSyed T, MD  sertraline (ZOLOFT) 50 MG tablet Take 1 tablet (50 mg total) by mouth daily. 10/18/18  Yes Arfeen, Phillips GroutSyed T, MD  benzonatate (TESSALON) 200 MG capsule Take 1 capsule (200 mg total) by mouth 3 (three) times daily as needed for up to 7 days for cough. 10/19/18 10/26/18  Stirling Orton C, PA-C  cetirizine-pseudoephedrine (ZYRTEC-D) 5-120 MG tablet Take 1 tablet by mouth daily. 10/19/18   Carlise Stofer C, PA-C  dicyclomine (BENTYL) 20 MG tablet Take 1 tablet (20 mg total) by mouth 4 (four) times daily -  before meals and at bedtime. 10/19/18    Aveon Colquhoun C, PA-C  ondansetron (ZOFRAN-ODT) 4 MG disintegrating tablet Take 1 tablet (4 mg total) by mouth every 8 (eight) hours as needed for nausea or vomiting. 10/19/18   Corinne Goucher, Junius CreamerHallie C, PA-C    Family History Family History  Problem Relation Age of Onset  . Cancer Maternal Grandmother     Social History Social History   Tobacco Use  . Smoking status: Former Smoker    Packs/day: 1.00    Last attempt to quit: 08/16/2017    Years since quitting: 1.1  . Smokeless tobacco: Never Used  Substance Use Topics  . Alcohol use: Yes    Comment: very rare  . Drug use: Yes    Types: Marijuana    Comment: not really     Allergies   Ibuprofen   Review of Systems Review of Systems  Constitutional: Negative for activity change, appetite change, chills, fatigue and fever.  HENT: Positive for congestion and rhinorrhea. Negative for ear pain, sinus pressure, sore throat and trouble swallowing.   Eyes: Negative for discharge and redness.  Respiratory: Positive for cough. Negative for chest tightness and shortness of breath.   Cardiovascular: Negative for chest pain.  Gastrointestinal: Positive for abdominal pain, diarrhea, nausea and vomiting.  Musculoskeletal: Negative for myalgias.  Skin: Negative for rash.  Neurological: Positive for headaches. Negative for  dizziness and light-headedness.     Physical Exam Triage Vital Signs ED Triage Vitals [10/19/18 1029]  Enc Vitals Group     BP 107/68     Pulse Rate 83     Resp 18     Temp (!) 97.3 F (36.3 C)     Temp Source Oral     SpO2 99 %     Weight      Height      Head Circumference      Peak Flow      Pain Score 8     Pain Loc      Pain Edu?      Excl. in GC?    No data found.  Updated Vital Signs BP 107/68 (BP Location: Left Arm)   Pulse 83   Temp (!) 97.3 F (36.3 C) (Oral)   Resp 18   SpO2 99%   Visual Acuity Right Eye Distance:   Left Eye Distance:   Bilateral Distance:    Right Eye Near:     Left Eye Near:    Bilateral Near:     Physical Exam Vitals signs and nursing note reviewed.  Constitutional:      Appearance: She is well-developed.     Comments: Appears uncomfortable, slightly swelling forward and backward holding her stomach  HENT:     Head: Normocephalic and atraumatic.     Ears:     Comments: Bilateral ears without tenderness to palpation of external auricle, tragus and mastoid, EAC's without erythema or swelling, TM's with good bony landmarks and cone of light. Non erythematous.  Eyes:     Conjunctiva/sclera: Conjunctivae normal.  Neck:     Musculoskeletal: Neck supple.  Cardiovascular:     Rate and Rhythm: Normal rate and regular rhythm.     Heart sounds: No murmur.  Pulmonary:     Effort: Pulmonary effort is normal. No respiratory distress.     Breath sounds: Normal breath sounds.     Comments: Breathing comfortably at rest, CTABL, no wheezing, rales or other adventitious sounds auscultated Abdominal:     Palpations: Abdomen is soft.     Tenderness: There is abdominal tenderness.     Comments: Abdomen soft, nondistended, tender to palpation to epigastrium, mid upper quadrant and right upper quadrant.  Minimal tenderness to palpation of bilateral lower quadrants, negative Rovsing, negative McBurney's.  Skin:    General: Skin is warm and dry.  Neurological:     Mental Status: She is alert.      UC Treatments / Results  Labs (all labs ordered are listed, but only abnormal results are displayed) Labs Reviewed - No data to display  EKG None  Radiology No results found.  Procedures Procedures (including critical care time)  Medications Ordered in UC Medications  ondansetron (ZOFRAN-ODT) disintegrating tablet 4 mg (4 mg Oral Given 10/19/18 1032)  metoCLOPramide (REGLAN) injection 10 mg (10 mg Intramuscular Given 10/19/18 1101)    Initial Impression / Assessment and Plan / UC Course  I have reviewed the triage vital signs and the nursing  notes.  Pertinent labs & imaging results that were available during my care of the patient were reviewed by me and considered in my medical decision making (see chart for details).     Patient was given Zofran ODT for her nausea, approximately 15 to 20 minutes later she continued to have active heaving and vomiting.  Provided patient with 10 mg IM Reglan.  Discussed with patient possibilities of symptoms  including viral illness/influenza causing upset stomach versus gallbladder/pancreatic or other abdominal etiology.  Patient seem to have more abdominal pain than what would be expected with viral illness, also seemed more focal to the upper abdominal area.  Continue to monitor patient to see if Reglan was going to help with symptoms.  Initially will prescribe Zofran and Bentyl to use at home, but while observing patient she opted to go to emergency room for further evaluation of her abdominal pain.  Patient was discharged with her father.Discussed strict return precautions. Patient verbalized understanding and is agreeable with plan.  Final Clinical Impressions(s) / UC Diagnoses   Final diagnoses:  Influenza-like illness  Nausea vomiting and diarrhea     Discharge Instructions     You are likely having a viral illness, possibly influenza with upper respiratory infection and stomach upset Please use Zofran around-the-clock for the first 24 to 48 hours every 6-8 hours; then transition to as needed as tolerating liquids. Push plenty of fluids, Pedialyte and Gatorade, slowly transition diet to blander foods like rice, toast crackers soups as you are tolerating liquids May use Bentyl 3 times a day and before bedtime to help with abdominal cramping If your abdominal pain/vomiting worsens, is not controlled with Zofran and improving with Bentyl, please go to the emergency room for further evaluation of your pain.  Once you are able to tolerate liquids you may try taking Zyrtec-D and using Tessalon  for congestion and cough May also consider daily Zyrtec/Claritin to help with congestion or Flonase nasal spray Over-the-counter cough recommendations include Delsym or Robitussin, Robitussin-DM  Please follow-up if symptoms not resolving, worsening, developing fevers, worsening abdominal pain, shortness of breath or difficulty breathing   ED Prescriptions    Medication Sig Dispense Auth. Provider   ondansetron (ZOFRAN-ODT) 4 MG disintegrating tablet Take 1 tablet (4 mg total) by mouth every 8 (eight) hours as needed for nausea or vomiting. 20 tablet Sahory Nordling C, PA-C   dicyclomine (BENTYL) 20 MG tablet Take 1 tablet (20 mg total) by mouth 4 (four) times daily -  before meals and at bedtime. 24 tablet Skyelyn Scruggs C, PA-C   benzonatate (TESSALON) 200 MG capsule Take 1 capsule (200 mg total) by mouth 3 (three) times daily as needed for up to 7 days for cough. 28 capsule Jumana Paccione C, PA-C   cetirizine-pseudoephedrine (ZYRTEC-D) 5-120 MG tablet Take 1 tablet by mouth daily. 20 tablet Thales Knipple, Broadus C, PA-C     Controlled Substance Prescriptions [place] Controlled Substance Registry consulted? Not Applicable   Lew Dawes, [place] 10/19/18 1134

## 2018-10-19 NOTE — ED Triage Notes (Signed)
Patient here from urgent care with complaints of upper epigastric abd pain, nausea, vomiting that started yesterday. Denies pregnancy.

## 2018-10-19 NOTE — ED Provider Notes (Signed)
[facility]-EMERGENCY DEPT Provider Note   CSN: [id number] Arrival date & time: 10/19/18  1156     History   Chief Complaint Chief Complaint  Patient presents with  . Abdominal Pain  . Nausea  . Emesis    HPI Paula Ortiz is a 23 y.o. female  no significant past medical history presenting today for evaluation of URI symptoms, abdominal pain, vomiting.  Patient states that over the past 2 days she has had cough and congestion. Last night she began to have abdominal pain as well as persistent nausea and vomiting through the night.  She is also had associated diarrhea.  She states that her pain has persistent since and feels sharp.  Feels as if it is radiating to her back.  Denies previous surgeries on her abdomen.  Notes that pain is in her upper abdomen.  Denies blood in the vomit, but believes there may have been a small amount of blood in her stool.  She has been unable to keep down liquids and solids.  Denies any known fevers.   HPI  Past Medical History:  Diagnosis Date  . Anxiety     There are no active problems to display for this patient.   History reviewed. No pertinent surgical history.   OB History   No obstetric history on file.      Home Medications    Prior to Admission medications   Medication Sig Start Date End Date Taking? Authorizing Provider  benzonatate (TESSALON) 200 MG capsule Take 1 capsule (200 mg total) by mouth 3 (three) times daily as needed for up to 7 days for cough. 10/19/18 10/26/18  Wieters, Hallie C, PA-C  cetirizine-pseudoephedrine (ZYRTEC-D) 5-120 MG tablet Take 1 tablet by mouth daily. 10/19/18   Wieters, Hallie C, PA-C  dicyclomine (BENTYL) 20 MG tablet Take 1 tablet (20 mg total) by mouth 4 (four) times daily -  before meals and at bedtime. 10/19/18   Wieters, Hallie C, PA-C  lamoTRIgine (LAMICTAL) 150 MG tablet Take 1 tablet (150 mg total) by mouth daily. 10/18/18   Arfeen, Phillips GroutSyed T, MD  ondansetron (ZOFRAN-ODT) 4 MG  disintegrating tablet Take 1 tablet (4 mg total) by mouth every 8 (eight) hours as needed for nausea or vomiting. 10/19/18   Wieters, Hallie C, PA-C  sertraline (ZOLOFT) 50 MG tablet Take 1 tablet (50 mg total) by mouth daily. 10/18/18   Arfeen, Phillips GroutSyed T, MD    Family History Family History  Problem Relation Age of Onset  . Cancer Maternal Grandmother     Social History Social History   Tobacco Use  . Smoking status: Former Smoker    Packs/day: 1.00    Last attempt to quit: 08/16/2017    Years since quitting: 1.1  . Smokeless tobacco: Never Used  Substance Use Topics  . Alcohol use: Yes    Comment: very rare  . Drug use: Yes    Types: Marijuana    Comment: not really     Allergies   Ibuprofen   Review of Systems Review of Systems  Constitutional: Negative for fever.  HENT: Positive for congestion.   Eyes: Negative for pain, redness and itching.  Respiratory: Positive for cough. Negative for shortness of breath.   Gastrointestinal: Positive for diarrhea, nausea and vomiting.  Genitourinary: Negative for decreased urine volume, dysuria, frequency and urgency.  Musculoskeletal: Positive for myalgias.  Skin: Negative for rash.  Neurological: Negative for weakness and headaches.  Psychiatric/Behavioral: Negative for confusion.  Physical Exam Updated Vital Signs BP 97/81 (BP Location: Left Arm)   Pulse 71   Temp 98.5 F (36.9 C) (Oral)   Resp 16   SpO2 100%   Physical Exam Vitals signs and nursing note reviewed.  Constitutional:      Appearance: She is well-developed.  HENT:     Head: Normocephalic and atraumatic.     Nose: Nose normal.     Mouth/Throat:     Mouth: Mucous membranes are moist.  Eyes:     Conjunctiva/sclera: Conjunctivae normal.  Neck:     Musculoskeletal: Neck supple.  Cardiovascular:     Rate and Rhythm: Normal rate and regular rhythm.  Pulmonary:     Effort: Pulmonary effort is normal.     Breath sounds: No wheezing or rales.    Abdominal:     General: Bowel sounds are decreased.     Palpations: Abdomen is soft.     Tenderness: There is abdominal tenderness in the right upper quadrant, epigastric area and left lower quadrant. There is no guarding or rebound.  Musculoskeletal: Normal range of motion.  Skin:    General: Skin is warm and dry.  Neurological:     Mental Status: She is alert and oriented to person, place, and time.  Psychiatric:        Mood and Affect: Mood normal.      ED Treatments / Results  Labs (all labs ordered are listed, but only abnormal results are displayed) Labs Reviewed  COMPREHENSIVE METABOLIC PANEL - Abnormal; Notable for the following components:      Result Value   Glucose, Bld 145 (*)    Total Protein 8.2 (*)    Albumin 5.1 (*)    AST 832 (*)    ALT 513 (*)    Total Bilirubin 2.6 (*)    All other components within normal limits  CBC - Abnormal; Notable for the following components:   WBC 14.5 (*)    RBC 5.67 (*)    MCV 70.9 (*)    MCH 22.0 (*)    All other components within normal limits  URINALYSIS, ROUTINE W REFLEX MICROSCOPIC - Abnormal; Notable for the following components:   Color, Urine AMBER (*)    Ketones, ur 5 (*)    Protein, ur 30 (*)    Bacteria, UA RARE (*)    All other components within normal limits  LIPASE, BLOOD  OCCULT BLOOD X 1 CARD TO LAB, STOOL  HEPATITIS PANEL, ACUTE  I-STAT BETA HCG BLOOD, ED ([facility], [facility], [facility] ONLY)  Radiology US Abdomen Complete  Result Date: 10/19/2018 CLINICAL DATA:  Acute generalized abdominal pain. EXAM: ABDOMEN ULTRASOUND COMPLETE COMPARISON:  None. FINDINGS: Gallbladder: No cholelithiasis is noted. No sonographic Murphy's sign is noted. However, there is noted severe wall thickening along 1 side of the gallbladder, measuring 7 mm, with possible pericholecystic fluid. Common bile duct: Diameter: 3 mm which is within normal limits. Liver: No focal lesion identified. Within normal limits in parenchymal echogenicity. Portal vein  is patent on color Doppler imaging with normal direction of blood flow towards the liver. IVC: No abnormality visualized. Pancreas: Visualized portion unremarkable. Spleen: Size and appearance within normal limits. Right Kidney: Length: 10.5 cm. Echogenicity within normal limits. No mass or hydronephrosis visualized. Left Kidney: Length: 11.4 cm. Echogenicity within normal limits. No mass or hydronephrosis visualized. Abdominal aorta: No aneurysm visualized. Other findings: None. IMPRESSION: Severe focal wall thickening is seen involving the gallbladder with possible pericholecystic fluid. No cholelithiasis or  sonographic Murphy's sign is noted. These findings are concerning for possible acute cholecystitis; HIDA scan may be performed for further evaluation. No other abnormality seen in the abdomen. Electronically Signed   By: Lupita Raider, M.D.   On: 10/19/2018 15:17    Procedures Procedures (including critical care time)  Medications Ordered in ED Medications  sodium chloride flush (NS) 0.9 % injection 3 mL (has no administration in time range)  0.9 %  sodium chloride infusion ( Intravenous Stopped 10/19/18 1425)  cefTRIAXone (ROCEPHIN) 1 g in sodium chloride 0.9 % 100 mL IVPB (has no administration in time range)  ondansetron (ZOFRAN) injection 4 mg (4 mg Intravenous Given 10/19/18 1341)  HYDROmorphone (DILAUDID) injection 0.5 mg (0.5 mg Intravenous Given 10/19/18 1500)     Initial Impression / Assessment and Plan / ED Course  I have reviewed the triage vital signs and the nursing notes. 23 y.o. female here with abdominal pain, n/v that started 2 days ago and has gotten worse. Consult with general surgery and they will see the patient now. Consult with hospitalist and they will take with the surgeon regarding the patient. HIDA scan ordered and acute hepatitis panel. Rocephin 1 gram IV. NPO.   Final Clinical Impressions(s) / ED Diagnoses   Final diagnoses:  Abdominal pain  Cholecystitis     ED Discharge Orders    None       Kerrie Buffalo [place], [place] 10/19/18 1635    Pricilla Loveless, MD 10/20/18 1122

## 2018-10-19 NOTE — ED Notes (Signed)
Patient able to ambulate independently, color returned to face.  Patient states she would like to go to the ER.

## 2018-10-19 NOTE — Discharge Instructions (Signed)
You are likely having a viral illness, possibly influenza with upper respiratory infection and stomach upset Please use Zofran around-the-clock for the first 24 to 48 hours every 6-8 hours; then transition to as needed as tolerating liquids. Push plenty of fluids, Pedialyte and Gatorade, slowly transition diet to blander foods like rice, toast crackers soups as you are tolerating liquids May use Bentyl 3 times a day and before bedtime to help with abdominal cramping If your abdominal pain/vomiting worsens, is not controlled with Zofran and improving with Bentyl, please go to the emergency room for further evaluation of your pain.  Once you are able to tolerate liquids you may try taking Zyrtec-D and using Tessalon for congestion and cough May also consider daily Zyrtec/Claritin to help with congestion or Flonase nasal spray Over-the-counter cough recommendations include Delsym or Robitussin, Robitussin-DM  Please follow-up if symptoms not resolving, worsening, developing fevers, worsening abdominal pain, shortness of breath or difficulty breathing

## 2018-10-19 NOTE — H&P (Signed)
History and Physical    Paula Ortiz EFE:[id number] DOB: 18-Nov-1995 DOA: 10/19/2018  PCP: Myles Lipps, MD  Patient coming from: Home  Chief Complaint: Abd pain  HPI: Paula Ortiz is a 23 y.o. female with medical history significant of Bipolar, anxiety, otherwise healthy presented with acute epigastric/RUQ pain that started several days prior to ED visit. Patient reports initially having URI symptoms, treated with OTC generic Robitussin and NSAID. The following day, patient reported sudden abd pain and nausea and intractable vomiting. Patient was seen at urgent care, initially thought to have viral illness. However, symptoms worsened, thus referred to ED for further work up.  ED Course: In the ED, patient underwent RUQ [place] with findings consistent with acute cholecystitis, however without Murphy's sign. Also, no gall stones were appreciated. LFT's noted to be elevated with AST/ALT in the 800/500's and total bili in excess of 2. General Surgery was consulted who recommended medical admission given elevated LFT's. Hospitalist consulted for consideration for admission.  Review of Systems:  Review of Systems  Constitutional: Negative for chills and weight loss.  HENT: Negative for congestion, ear discharge, ear pain and nosebleeds.   Eyes: Negative for double vision, photophobia and pain.  Respiratory: Negative for hemoptysis, sputum production and shortness of breath.   Cardiovascular: Negative for palpitations, orthopnea, claudication and leg swelling.  Gastrointestinal: Positive for abdominal pain, nausea and vomiting.  Genitourinary: Negative for frequency, hematuria and urgency.  Musculoskeletal: Negative for back pain, joint pain and neck pain.  Neurological: Negative for tingling, tremors, seizures, loss of consciousness and weakness.  Psychiatric/Behavioral: Negative for hallucinations and substance abuse. The patient does not have insomnia.     Past Medical History:  Diagnosis Date    . Anxiety   . Bipolar disorder (HCC) 10/19/2018    History reviewed. No pertinent surgical history.   reports that she quit smoking about 14 months ago. She smoked 1.00 pack per day. She has never used smokeless tobacco. She reports current alcohol use. She reports current drug use. Drug: Marijuana.  Allergies  Allergen Reactions  . Ibuprofen Hives    Family History  Problem Relation Age of Onset  . Cancer Maternal Grandmother     Prior to Admission medications   Medication Sig Start Date End Date Taking? Authorizing Provider  benzonatate (TESSALON) 200 MG capsule Take 1 capsule (200 mg total) by mouth 3 (three) times daily as needed for up to 7 days for cough. 10/19/18 10/26/18  Wieters, Hallie C, PA-C  cetirizine-pseudoephedrine (ZYRTEC-D) 5-120 MG tablet Take 1 tablet by mouth daily. 10/19/18   Wieters, Hallie C, PA-C  dicyclomine (BENTYL) 20 MG tablet Take 1 tablet (20 mg total) by mouth 4 (four) times daily -  before meals and at bedtime. 10/19/18   Wieters, Hallie C, PA-C  lamoTRIgine (LAMICTAL) 150 MG tablet Take 1 tablet (150 mg total) by mouth daily. 10/18/18   Arfeen, Phillips Grout, MD  ondansetron (ZOFRAN-ODT) 4 MG disintegrating tablet Take 1 tablet (4 mg total) by mouth every 8 (eight) hours as needed for nausea or vomiting. 10/19/18   Wieters, Hallie C, PA-C  sertraline (ZOLOFT) 50 MG tablet Take 1 tablet (50 mg total) by mouth daily. 10/18/18   Cleotis Nipper, MD    Physical Exam: Vitals:   10/19/18 1321 10/19/18 1503 10/19/18 1537  BP: 113/69  97/81  Pulse: 77 79 71  Resp:   16  Temp: 98 F (36.7 C)  98.5 F (36.9 C)  TempSrc: Oral  Oral  SpO2:  100% 100% 100%    Constitutional: NAD, calm, comfortable Vitals:   10/19/18 1321 10/19/18 1503 10/19/18 1537  BP: 113/69  97/81  Pulse: 77 79 71  Resp:   16  Temp: 98 F (36.7 C)  98.5 F (36.9 C)  TempSrc: Oral  Oral  SpO2: 100% 100% 100%   Eyes: PERRL, lids and conjunctivae normal ENMT: Mucous membranes are moist.  Posterior pharynx clear of any exudate or lesions.Normal dentition.  Neck: normal, supple, no masses, no thyromegaly Respiratory: clear to auscultation bilaterally, no wheezing, no crackles. Normal respiratory effort. No accessory muscle use.  Cardiovascular: Regular rate and rhythm, s1, s2 Abdomen: epigastric tenderness, RUQ tenderness, pos BS Musculoskeletal: no clubbing / cyanosis. No joint deformity upper and lower extremities. Good ROM, no contractures. Normal muscle tone.  Skin: no rashes, lesions, ulcers. No induration Neurologic: CN 2-12 grossly intact. Sensation intact, DTR normal. Strength 5/5 in all 4.  Psychiatric: Normal judgment and insight. Alert and oriented x 3. Normal mood.    Labs on Admission: I have personally reviewed following labs and imaging studies  CBC: Recent Labs  Lab 10/19/18 1331  WBC 14.5*  HGB 12.5  HCT 40.2  MCV 70.9*  PLT 204   Basic Metabolic Panel: Recent Labs  Lab 10/19/18 1331  NA 138  K 3.5  CL 106  CO2 22  GLUCOSE 145*  BUN 9  CREATININE 0.59  CALCIUM 9.7   GFR: Estimated Creatinine Clearance: 83.2 mL/min (by C-G formula based on SCr of 0.59 mg/dL). Liver Function Tests: Recent Labs  Lab 10/19/18 1331  AST 832*  ALT 513*  ALKPHOS 121  BILITOT 2.6*  PROT 8.2*  ALBUMIN 5.1*   Recent Labs  Lab 10/19/18 1331  LIPASE 29   No results for input(s): AMMONIA in the last 168 hours. Coagulation Profile: No results for input(s): INR, PROTIME in the last 168 hours. Cardiac Enzymes: No results for input(s): CKTOTAL, CKMB, CKMBINDEX, TROPONINI in the last 168 hours. BNP (last 3 results) No results for input(s): PROBNP in the last 8760 hours. HbA1C: No results for input(s): HGBA1C in the last 72 hours. CBG: No results for input(s): GLUCAP in the last 168 hours. Lipid Profile: No results for input(s): CHOL, HDL, LDLCALC, TRIG, CHOLHDL, LDLDIRECT in the last 72 hours. Thyroid Function Tests: No results for input(s): TSH,  T4TOTAL, FREET4, T3FREE, THYROIDAB in the last 72 hours. Anemia Panel: No results for input(s): VITAMINB12, FOLATE, FERRITIN, TIBC, IRON, RETICCTPCT in the last 72 hours. Urine analysis:    Component Value Date/Time   COLORURINE AMBER (A) 10/19/2018 1348   APPEARANCEUR CLEAR 10/19/2018 1348   LABSPEC 1.025 10/19/2018 1348   PHURINE 8.0 10/19/2018 1348   GLUCOSEU NEGATIVE 10/19/2018 1348   HGBUR NEGATIVE 10/19/2018 1348   BILIRUBINUR NEGATIVE 10/19/2018 1348   KETONESUR 5 (A) 10/19/2018 1348   PROTEINUR 30 (A) 10/19/2018 1348   NITRITE NEGATIVE 10/19/2018 1348   LEUKOCYTESUR NEGATIVE 10/19/2018 1348   Sepsis Labs: !!!!!!!!!!!!!!!!!!!!!!!!!!!!!!!!!!!!!!!!!!!! @LABRCNTIP (procalcitonin:4,lacticidven:4) )No results found for this or any previous visit (from the past 240 hour(s)).   Radiological Exams on Admission: US personally reviewed [place] Abdomen Complete  Result Date: 10/19/2018 CLINICAL DATA:  Acute generalized abdominal pain. EXAM: ABDOMEN ULTRASOUND COMPLETE COMPARISON:  None. FINDINGS: Gallbladder: No cholelithiasis is noted. No sonographic Murphy's sign is noted. However, there is noted severe wall thickening along 1 side of the gallbladder, measuring 7 mm, with possible pericholecystic fluid. Common bile duct: Diameter: 3 mm which is within normal limits. Liver: No focal  lesion identified. Within normal limits in parenchymal echogenicity. Portal vein is patent on color Doppler imaging with normal direction of blood flow towards the liver. IVC: No abnormality visualized. Pancreas: Visualized portion unremarkable. Spleen: Size and appearance within normal limits. Right Kidney: Length: 10.5 cm. Echogenicity within normal limits. No mass or hydronephrosis visualized. Left Kidney: Length: 11.4 cm. Echogenicity within normal limits. No mass or hydronephrosis visualized. Abdominal aorta: No aneurysm visualized. Other findings: None. IMPRESSION: Severe focal wall thickening is seen involving the  gallbladder with possible pericholecystic fluid. No cholelithiasis or sonographic Murphy's sign is noted. These findings are concerning for possible acute cholecystitis; HIDA scan may be performed for further evaluation. No other abnormality seen in the abdomen. Electronically Signed   By: Lupita Raider, M.D.   On: 10/19/2018 15:17     Assessment/Plan Active Problems:   Bipolar disorder (HCC)   Acute cholecystitis   1. Acute cholecystitis 1. US findings worrisome for acute cholecystitis. No obvious stones noted 2. AST/ALT at 800's/500's, total Bili elevated at 2.6 3. HIDA already ordered, pending 4. Follow up on acute hepatitis panel that has already been ordered, pending 5. Repeat LFT in AM 6. Keep NPO 7. General Surgery consulted by EDP, who is planning surgery in AM, recommended medical admit 2. Elevated LFT 1. Per above 2. Repeat LFT in AM 3. Avoid hepato-toxic agents 4. Follow up hepatitis panel 3. Bipolar 1. Seems stable 2. Would resume home meds when able to take PO  DVT prophylaxis: Lovenox subQ  Code Status: Full Family Communication: Pt in room, family at bedside  Disposition Plan: Uncertain at this time  Consults called: General Surgery Admission status: Observation as would likely require less than 2 midnight stay for gall bladder surgery   Rickey Barbara MD [facility] Pager On Amion  If 7PM-7AM, please contact night-coverage  10/19/2018, 5:44 PM

## 2018-10-19 NOTE — ED Triage Notes (Signed)
Pt presents to [facility] for assessment of cough, congestion, and 12 episodes of emesis with some diarrhea since yesterday/

## 2018-10-19 NOTE — ED Notes (Signed)
Pt continues to have emesis while in office.  Pale, diaphoretic, and goose bumps noted.

## 2018-10-20 ENCOUNTER — Encounter (HOSPITAL_COMMUNITY): Payer: Self-pay | Admitting: Anesthesiology

## 2018-10-20 ENCOUNTER — Encounter (HOSPITAL_COMMUNITY)
Admission: EM | Disposition: A | Discharge status: Home / Self Care | Payer: Self-pay | Source: Home / Self Care | Attending: Emergency Medicine

## 2018-10-20 DIAGNOSIS — R945 Abnormal results of liver function studies: Secondary | ICD-10-CM | POA: Diagnosis not present

## 2018-10-20 DIAGNOSIS — R05 Cough: Secondary | ICD-10-CM | POA: Diagnosis not present

## 2018-10-20 DIAGNOSIS — R112 Nausea with vomiting, unspecified: Secondary | ICD-10-CM | POA: Diagnosis not present

## 2018-10-20 DIAGNOSIS — R1084 Generalized abdominal pain: Secondary | ICD-10-CM | POA: Diagnosis not present

## 2018-10-20 DIAGNOSIS — F319 Bipolar disorder, unspecified: Secondary | ICD-10-CM | POA: Diagnosis not present

## 2018-10-20 DIAGNOSIS — K810 Acute cholecystitis: Secondary | ICD-10-CM | POA: Diagnosis not present

## 2018-10-20 LAB — HEPATITIS PANEL, ACUTE
HCV Ab: <0.1 s/co ratio (ref 0.0–0.9)
HEP A IGM: NEGATIVE
Hep B C IgM: NEGATIVE
Hepatitis B Surface Ag: NEGATIVE

## 2018-10-20 LAB — CBC
HCT: 34.4 % — ABNORMAL LOW (ref 36.0–46.0)
Hemoglobin: 10.5 g/dL — ABNORMAL LOW (ref 12.0–15.0)
MCH: 21.7 pg — ABNORMAL LOW (ref 26.0–34.0)
MCHC: 30.5 g/dL (ref 30.0–36.0)
MCV: 71.1 fL — ABNORMAL LOW (ref 80.0–100.0)
Platelets: 221 10*3/uL (ref 150–400)
RBC: 4.84 MIL/uL (ref 3.87–5.11)
RDW: 14 % (ref 11.5–15.5)
WBC: 10.7 10*3/uL — AB (ref 4.0–10.5)
nRBC: 0 % (ref 0.0–0.2)

## 2018-10-20 LAB — COMPREHENSIVE METABOLIC PANEL
ALBUMIN: 3.9 g/dL (ref 3.5–5.0)
ALT: 338 U/L — ABNORMAL HIGH (ref 0–44)
AST: 225 U/L — ABNORMAL HIGH (ref 15–41)
Alkaline Phosphatase: 94 U/L (ref 38–126)
Anion gap: 6 (ref 5–15)
BUN: 6 mg/dL (ref 6–20)
CO2: 23 mmol/L (ref 22–32)
Calcium: 8.9 mg/dL (ref 8.9–10.3)
Chloride: 112 mmol/L — ABNORMAL HIGH (ref 98–111)
Creatinine, Ser: 0.64 mg/dL (ref 0.44–1.00)
GFR calc Af Amer: >60 mL/min (ref >60)
GFR calc non Af Amer: >60 mL/min (ref >60)
GLUCOSE: 104 mg/dL — AB (ref 70–99)
POTASSIUM: 3.4 mmol/L — AB (ref 3.5–5.1)
Sodium: 141 mmol/L (ref 135–145)
TOTAL PROTEIN: 6.5 g/dL (ref 6.5–8.1)
Total Bilirubin: 1.1 mg/dL (ref 0.3–1.2)

## 2018-10-20 LAB — LIPASE, BLOOD: Lipase: 31 U/L (ref 11–51)

## 2018-10-20 SURGERY — LAPAROSCOPIC CHOLECYSTECTOMY WITH INTRAOPERATIVE CHOLANGIOGRAM
Anesthesia: General

## 2018-10-20 NOTE — Progress Notes (Signed)
    CC: Abdominal pain and elevated LFTs  Subjective: Patient feels fine this a.m. no further pain nausea or vomiting.  We discussed the findings and tentatively plan to send her home this morning after she eats if there are no issues.  Objective: Vital signs in last 24 hours: Temp:  [97.3 F (36.3 C)-99.1 F (37.3 C)] 98.1 F (36.7 C) (02/14 0617) Pulse Rate:  [71-91] 91 (02/14 0617) Resp:  [16-18] 17 (02/14 0617) BP: (97-113)/(68-81) 100/68 (02/14 0617) SpO2:  [99 %-100 %] 99 % (02/14 0617) Last BM Date: 10/18/18 1847 IV NPO Afebrile vital signs are stable LFTs are improving and WBC is down to 10.7 , Ultrasound showed some focal wall thickening in the gallbladder with possible pericholecystic fluid no cholelithiasis HIDA scan shows no evidence of cystic duct or common bile duct obstruction. Intake/Output from previous day: 02/13 0701 - 02/14 0700 In: 1847.9 [I.V.:1847.9] Out: -  Intake/Output this shift: No intake/output data recorded.  General appearance: alert, cooperative and no distress Resp: clear to auscultation bilaterally GI: soft, non-tender; bowel sounds normal; no masses,  no organomegaly  Lab Results:  Recent Labs    10/19/18 1331 10/20/18 0340  WBC 14.5* 10.7*  HGB 12.5 10.5*  HCT 40.2 34.4*  PLT 204 221    BMET Recent Labs    10/19/18 1331 10/20/18 0340  NA 138 141  K 3.5 3.4*  CL 106 112*  CO2 22 23  GLUCOSE 145* 104*  BUN 9 6  CREATININE 0.59 0.64  CALCIUM 9.7 8.9   PT/INR No results for input(s): LABPROT, INR in the last 72 hours.  Recent Labs  Lab 10/19/18 1331 10/20/18 0340  AST 832* 225*  ALT 513* 338*  ALKPHOS 121 94  BILITOT 2.6* 1.1  PROT 8.2* 6.5  ALBUMIN 5.1* 3.9     Lipase     Component Value Date/Time   LIPASE 31 10/20/2018 0340     Medications: . enoxaparin (LOVENOX) injection  40 mg Subcutaneous Q24H  . sodium chloride flush  3 mL Intravenous Once    Assessment/Plan  Abdominal pain, nausea and  vomiting Elevated LFT's Abnormal Ultrasound URI Bipolar disorder  FEN:  IV fluids/NPO ID:  Rocephin DVT:  Lovenox Follow up:  PCP  Plan: Let her eat a regular diet.  If there is no further issues she can go home and follow-up with her PCP.  LOS: 0 days    Sherwood Castilla 10/20/[id number]

## 2018-10-20 NOTE — Discharge Instructions (Signed)
Abdominal Pain, Adult If you get pain like this again, come back to the hospital ER so they can recheck things for you. Abdominal pain can be caused by many things. Often, abdominal pain is not serious and it gets better with no treatment or by being treated at home. However, sometimes abdominal pain is serious. Your health care provider will do a medical history and a physical exam to try to determine the cause of your abdominal pain. Follow these instructions at home:  Take over-the-counter and prescription medicines only as told by your health care provider. Do not take a laxative unless told by your health care provider.  Drink enough fluid to keep your urine clear or pale yellow.  Watch your condition for any changes.  Keep all follow-up visits as told by your health care provider. This is important. Contact a health care provider if:  Your abdominal pain changes or gets worse.  You are not hungry or you lose weight without trying.  You are constipated or have diarrhea for more than 2-3 days.  You have pain when you urinate or have a bowel movement.  Your abdominal pain wakes you up at night.  Your pain gets worse with meals, after eating, or with certain foods.  You are throwing up and cannot keep anything down.  You have a fever. Get help right away if:  Your pain does not go away as soon as your health care provider told you to expect.  You cannot stop throwing up.  Your pain is only in areas of the abdomen, such as the right side or the left lower portion of the abdomen.  You have bloody or black stools, or stools that look like tar.  You have severe pain, cramping, or bloating in your abdomen.  You have signs of dehydration, such as: ? Dark urine, very little urine, or no urine. ? Cracked lips. ? Dry mouth. ? Sunken eyes. ? Sleepiness. ? Weakness. This information is not intended to replace advice given to you by your health care provider. Make sure you  discuss any questions you have with your health care provider. Document Released: 06/02/2005 Document Revised: 03/12/2016 Document Reviewed: 02/04/2016 Elsevier Interactive Patient Education  2019 [employer].

## 2018-10-24 NOTE — Discharge Summary (Signed)
Physician Discharge Summary  Patient ID: Paula Ortiz MRN: [id number] DOB/AGE: 11-06-1995 22 y.o.  Admit date: 10/19/2018 Discharge date: 10/19/2018  Admission Diagnoses:  Abdominal pain, nausea and vomiting Elevated LFT's Abnormal Ultrasound URI Bipolar disorder Discharge Diagnoses:  Active Problems:   Bipolar disorder (HCC)   Acute cholecystitis   PROCEDURES: None  Hospital Course: Patient is a 23 year old female who presented to urgent care this morning with complaints of cough, congestion starting a couple days ago.  Last night she started having nausea and vomiting, along with diarrhea, then abdominal pain.  He boyfriend has also had a URI, she thought she got it from him.  They have not been traveling, and not eaten at any exotic foods.  She is a Consulting civil engineer here in town.   She continue to be diaphoretic with nausea and vomiting in the Urgent Care office.    She was sent to the ED from the urgent care center.  Work-up in the ED/urgent care shows she is afebrile vital signs are stable. Labs shows a glucose of 145 electrolytes are normal.  Albumin is 5.1 lipase is 29, AST 832, ALT 513, total bilirubin 2.6.  WBC 14.5, hemoglobin 12.5, hematocrit 40.2, platelets 204,000.  Urinalysis is clear and unremarkable hCG is less than 5.0.  Abdominal ultrasound: There is no cholelithiasis noted.  No sonographic Eulah Pont sign however there is severe wall thickening all along 1 side of the gallbladder measuring 7 mm with possible pericholecystic fluid.  The common bile duct is 3 mm.  The liver showed no focal lesions the IVC was normal.  It was radiology's opinion these findings were concerning for acute cholecystitis but not definitive.  We are asked to see.  Patient was admitted to medicine, HIDA scan was obtained.  Showed no evidence of cystic duct or common bile duct obstruction.  Patient was pain-free the next a.m.  Tolerated a soft diet.  Her acute hepatitis panel was also negative. Repeat labs  following a.m. showed a lipase of 31, AST 225, ALT 338, WBC was down to 10.7.  She was examined and evaluated by Dr. Daphine Deutscher.  She was pain-free, she was not interested in cholecystectomy and it was Dr. Ermalene Searing recommendation she be discharged home.  CBC Latest Ref Rng & Units 10/20/2018 10/19/2018 02/16/2018  WBC 4.0 - 10.5 K/uL 10.7(H) 14.5(H) 6.2  Hemoglobin 12.0 - 15.0 g/dL 10.5(L) 12.5 11.8(A)  Hematocrit 36.0 - 46.0 % 34.4(L) 40.2 37.5(A)  Platelets 150 - 400 K/uL 221 204 -   CMP Latest Ref Rng & Units 10/20/2018 10/19/2018  Glucose 70 - 99 mg/dL 409(W) 119(J)  BUN 6 - 20 mg/dL 6 9  Creatinine 4.78 - 1.00 mg/dL 2.95 6.21  Sodium 308 - 145 mmol/L 141 138  Potassium 3.5 - 5.1 mmol/L 3.4(L) 3.5  Chloride 98 - 111 mmol/L 112(H) 106  CO2 22 - 32 mmol/L 23 22  Calcium 8.9 - 10.3 mg/dL 8.9 9.7  Total Protein 6.5 - 8.1 g/dL 6.5 6.5(H)  Total Bilirubin 0.3 - 1.2 mg/dL 1.1 8.4(O)  Alkaline Phos 38 - 126 U/L 94 121  AST 15 - 41 U/L 225(H) 832(H)  ALT 0 - 44 U/L 338(H) 513(H)    HIDA :  Prompt uptake and biliary excretion of activity by the liver is seen. Gallbladder activity is visualized, consistent with patency of cystic duct. Biliary activity passes into small bowel, consistent with patent common bile duct.  RUQ NG:[id number] focal wall thickening is seen involving the gallbladder with possible pericholecystic fluid. No  cholelithiasis or sonographic Murphy's sign is noted. These findings are concerning for possible acute cholecystitis; HIDA scan may be performed for further evaluation.   Condition on discharge: Improved   Disposition: Home   Allergies as of 10/20/2018      Reactions   Ibuprofen Hives      Medication List    STOP taking these medications   benzonatate 200 MG capsule Commonly known as:  TESSALON   cetirizine-pseudoephedrine 5-120 MG tablet Commonly known as:  ZYRTEC-D   dicyclomine 20 MG tablet Commonly known as:  BENTYL     TAKE these medications    lamoTRIgine 150 MG tablet Commonly known as:  LAMICTAL Take 1 tablet (150 mg total) by mouth daily.   ondansetron 4 MG disintegrating tablet Commonly known as:  ZOFRAN-ODT Take 1 tablet (4 mg total) by mouth every 8 (eight) hours as needed for nausea or vomiting.   sertraline 50 MG tablet Commonly known as:  ZOLOFT Take 1 tablet (50 mg total) by mouth daily.      Follow-up Information    Myles Lipps, MD Follow up.   Specialty:  Family Medicine Why:  Follow-up as needed for additional discomfort. Contact information: [address] [place] [address] [phone]           Signed: Sherrie George 10/24/2018, 9:54 AM

## 2018-11-28 ENCOUNTER — Ambulatory Visit (INDEPENDENT_AMBULATORY_CARE_PROVIDER_SITE_OTHER): Payer: 59 | Admitting: Family Medicine

## 2018-11-28 ENCOUNTER — Other Ambulatory Visit: Payer: Self-pay

## 2018-11-28 DIAGNOSIS — Z3042 Encounter for surveillance of injectable contraceptive: Secondary | ICD-10-CM

## 2018-11-28 DIAGNOSIS — Z789 Other specified health status: Secondary | ICD-10-CM

## 2018-11-28 MED ORDER — MEDROXYPROGESTERONE ACETATE 150 MG/ML IM SUSY
150.0000 mg | PREFILLED_SYRINGE | INTRAMUSCULAR | Status: AC
  Administered 2018-11-28 – 2020-11-03 (×5): 150 mg via INTRAMUSCULAR

## 2018-11-28 NOTE — Patient Instructions (Signed)
Received depo shot on the left glut muscle

## 2018-12-18 ENCOUNTER — Telehealth: Payer: 59 | Admitting: Family

## 2018-12-18 DIAGNOSIS — B86 Scabies: Secondary | ICD-10-CM

## 2018-12-18 MED ORDER — PERMETHRIN 5 % EX CREA: 1.0000 "application " | TOPICAL_CREAM | Freq: Once | CUTANEOUS | 0 refills | Status: AC

## 2018-12-18 MED FILL — PERMETHRIN 5% CREAM: 5 | 7 days supply | Qty: 60 | Fill #0

## 2018-12-18 NOTE — Progress Notes (Signed)
E-Visit for Scabies  We are sorry that you are not feeling well. Here is how we plan to help!  Based on what you shared with me it looks like you have scabies.  Scabies is an infection caused by very tiny mites that burrow into the skin.  They cause severe itching. Though children are most commonly infected, anyone can get scabies.  Scabies mites can pass from person to person through physical close contact.  They can also be passed through shared clothing, towels, and bedding.  Scabies infection is not usually dangerous, but it is uncomfortable.  Because it is so contagious, scabies should be treated immediately to keep the infection from spreading.  If you have children in day care, please have any exposed children examined by their pediatrician. .   I have prescribed Permethrin topical cream 5% thoroughly massage cream from head to soles of feet; leave on for 8 to 14 hours before removing (shower or bath). May repeat if living mites are observed 14 days after first treatment; one application is generally curative.   Approximately 5 minutes was spent documenting and reviewing patient's chart.    HOME CARE:  . You should treat all members in your household whether they show symptoms or not. . Wash towels, clothes, bed linens, cloth toys, hats, and other personal items in hot water and dry on high heat. . Vacuum floors and furniture and throw away the bag afterwards. . Keep your child home from day care or school until the morning after treatment for scabies. . Notify your child's day care or school so that other children can be checked and treated.  GET HELP RIGHT AWAY IF:  . The infected person has a fever, red streaks, pain, or swelling of the skin. . Sores get worse or don't heal. . New rashes appear or itching continues for more than 2 weeks after treatment.  MAKE SURE YOU:   Understand these instructions.  Will watch your condition.  Will get help right away if you are not  doing well or get worse.  Thank you for choosing an e-visit. Your e-visit answers were reviewed by a board certified advanced clinical practitioner to complete your personal care plan.  Depending upon the condition, your plan could have included both over the counter or prescription medications.    Please review your pharmacy choice.  Make sure the pharmacy is open so you can pick up prescription now.   If there is a problem, you may contact your provider through MyChart messaging and have the prescription routed to another pharmacy. Your safety is important to us.  If you have drug allergies check your prescription carefully.    For the next 24 hours you can use MyChart to ask questions about today's visit, request a non-urgent call back, or ask for a work or school excuse.  You will get an email in the next 2 days asking about your experience.  I hope that your e-visit has been valuable and will speed your recovery.    

## 2018-12-19 ENCOUNTER — Telehealth (INDEPENDENT_AMBULATORY_CARE_PROVIDER_SITE_OTHER): Payer: 59 | Admitting: Family Medicine

## 2018-12-19 ENCOUNTER — Other Ambulatory Visit: Payer: Self-pay

## 2018-12-19 DIAGNOSIS — R21 Rash and other nonspecific skin eruption: Secondary | ICD-10-CM

## 2018-12-19 MED ORDER — TRIAMCINOLONE ACETONIDE 0.1 % EX CREA: 1.0000 "application " | TOPICAL_CREAM | Freq: Two times a day (BID) | CUTANEOUS | 0 refills | Status: DC

## 2018-12-19 MED FILL — TRIAMCINOLONE 0.1% CREAM: 0.1 | 15 days supply | Qty: 45 | Fill #0

## 2018-12-19 NOTE — Progress Notes (Signed)
CC- patient has a rash in different area of body. Itchy and red. Patient has uploaded picture to her my chart visit. Which I found under chart review-then yesterday e-visit-then media at the bottom of page. (clink on link images) The Triage Nurse stated that it was scabies but patient do not think it is scabies.

## 2018-12-19 NOTE — Progress Notes (Signed)
Virtual Visit via Telephone Note  I connected with Paula Ortiz on 12/19/18 at 5:55 PM by telephone and verified that I am speaking with the correct person using two identifiers.   I discussed the limitations, risks, security and privacy concerns of performing an evaluation and management service by telephone and the availability of in person appointments. I also discussed with the patient that there may be a patient responsible charge related to this service. The patient expressed understanding and agreed to proceed, consent obtained  Chief complaint: Rash   History of Present Illness:   Rash:   Started 1 week ago.  Initially on elbows and forearms. Itchy.  Self quarantined. No work in yard, no exposure to plants. Inside cat.  No known sick contacts. Boyfriend sleeps in same bed, and he has not had rash.  No new meds.  On zoloft, on lamictal.  Stopped both 1 week ago due to concern about lamictal rash, but rash has worsened.  Treated by Dr. Kathi Ludwig for Bipolar d/o, but feels stable off meds.  Bilirubin normalized from 2.6-1.1 February 13 to February 14, at time of suspected acute cholecystitis. Denies recent abd pain/n/v. No jaundice of skin or eyes.   Has spread over the course of the week - hands, ankles, abdomen.  No face/genital/mouth lesions. No fever. Feels well otherwise.  No new derm products, soap/detergents, or other cosmetics.   Back of hands, but not b/t fingers. Has had dyshidrotic eczema - but affects palms.   Tx: none.   Evaluated yesterday by ED visit, thought to have possible scabies.  Prescribed permethrin 5% cream. Has not yet purchased.      Patient Active Problem List   Diagnosis Date Noted  . Bipolar disorder (HCC) 10/19/2018  . Acute cholecystitis 10/19/2018   Past Medical History:  Diagnosis Date  . Anxiety   . Bipolar disorder (HCC) 10/19/2018   No past surgical history on file. Allergies  Allergen Reactions  . Ibuprofen Hives   Prior to  Admission medications   Medication Sig Start Date End Date Taking? Authorizing Provider  lamoTRIgine (LAMICTAL) 150 MG tablet Take 1 tablet (150 mg total) by mouth daily. 10/18/18  Yes Arfeen, Phillips Grout, MD  sertraline (ZOLOFT) 50 MG tablet Take 1 tablet (50 mg total) by mouth daily. Patient not taking: Reported on 12/19/2018 10/18/18   Cleotis Nipper, MD   Social History   Socioeconomic History  . Marital status: Single    Spouse name: Not on file  . Number of children: 0  . Years of education: Not on file  . Highest education level: Bachelor's degree (e.g., BA, AB, BS)  Occupational History  . Not on file  Social Needs  . Financial resource strain: Not hard at all  . Food insecurity:    Worry: Never true    Inability: Never true  . Transportation needs:    Medical: No    Non-medical: No  Tobacco Use  . Smoking status: Former Smoker    Packs/day: 1.00    Last attempt to quit: 08/16/2017    Years since quitting: 1.3  . Smokeless tobacco: Never Used  Substance and Sexual Activity  . Alcohol use: Yes    Comment: very rare  . Drug use: Yes    Types: Marijuana    Comment: not really  . Sexual activity: Yes    Birth control/protection: Injection  Lifestyle  . Physical activity:    Days per week: 0 days    Minutes per  session: 0 min  . Stress: Very much  Relationships  . Social connections:    Talks on phone: More than three times a week    Gets together: More than three times a week    Attends religious service: Never    Active member of club or organization: No    Attends meetings of clubs or organizations: Never    Relationship status: Never married  . Intimate partner violence:    Fear of current or ex partner: No    Emotionally abused: No    Physically abused: No    Forced sexual activity: No  Other Topics Concern  . Not on file  Social History Narrative  . Not on file     Observations/Objective: Images uploaded yesterday reviewed.  Small excoriated papules  noted on leg, Dorsal hand, dorsal forearm, and elbow small oval patch on abdomen. Assessment and Plan: Rash and nonspecific skin eruption - Plan: triamcinolone cream (KENALOG) 0.1 %  -Pruritic rash initially affecting arms/elbows, now spreading to other areas of body including hands.  Differential includes scabies, versus contact dermatitis, versus less likely Lamictal rash.  No fever or systemic symptoms, no oral/genital or facial lesions.  -Has stopped Lamictal.  Can discuss restart with psychiatrist once rash improves.  -Recommend trying permethrin cream, appropriate use discussed including washing of bedding close in the morning.  -Benadryl at bedtime as needed, Zyrtec daily for itching.  Potential side effects discussed.  Triamcinolone cream twice daily to 3 times daily to affected areas as needed.  -RTC precautions if symptoms or not improving this week or acute worsening symptoms. Could consider repeat lft's if persistent symptoms given prior elevation, but less likely pruritus from liver/biliary issue without any other symptoms of such.  RTC precautions were discussed.   Follow Up Instructions: As above.   Patient Instructions    As we discussed there are many possible causes of an itchy rash, but I think it is reasonable to try the permethrin cream that was prescribed yesterday to treat for possible scabies.  Can take a Benadryl at night if needed for itching, Zyrtec over-the-counter once per day to help with itching, and I also prescribed some steroid cream that can be used 2 or 3 times per day to affected areas.  If that rash is not improving as the week goes on, or any acute worsening including rash into the genitals, face or mouth, please call for repeat visit as we may need to change plan at that time.  Return to the clinic or go to the nearest emergency room if any of your symptoms worsen or new symptoms occur.   Rash, Adult A rash is a change in the color of your skin. A rash  can also change the way your skin feels. There are many different conditions and factors that can cause a rash. Some rashes may disappear after a few days, but some may last for a few weeks. Common causes of rashes include:  Viral infections, such as: ? Colds. ? Measles. ? Hand, foot, and mouth disease.  Bacterial infections, such as: ? Scarlet fever. ? Impetigo.  Fungal infections, such as Candida.  Allergic reactions to food, medicines, or skin care products. Follow these instructions at home: The goal of treatment is to stop the itching and keep the rash from spreading. Pay attention to any changes in your symptoms. Follow these instructions to help with your condition: Medicine Take or apply over-the-counter and prescription medicines only as told by your  health care provider. These may include:  Corticosteroid creams to treat red or swollen skin.  Anti-itch lotions.  Oral allergy medicines (antihistamines).  Oral corticosteroids for severe symptoms.  Skin care  Apply cool compresses to the affected areas.  Do not scratch or rub your skin.  Avoid covering the rash. Make sure the rash is exposed to air as much as possible. Managing itching and discomfort  Avoid hot showers or baths, which can make itching worse. A cold shower may help.  Try taking a bath with: ? Epsom salts. Follow manufacturer instructions on the packaging. You can get these at your local pharmacy or grocery store. ? Baking soda. Pour a small amount into the bath as told by your health care provider. ? Colloidal oatmeal. Follow manufacturer instructions on the packaging. You can get this at your local pharmacy or grocery store.  Try applying baking soda paste to your skin. Stir water into baking soda until it reaches a paste-like consistency.  Try applying calamine lotion. This is an over-the-counter lotion that helps to relieve itchiness.  Keep cool and out of the sun. Sweating and being hot can  make itching worse. General instructions   Rest as needed.  Drink enough fluid to keep your urine pale yellow.  Wear loose-fitting clothing.  Avoid scented soaps, detergents, and perfumes. Use gentle soaps, detergents, perfumes, and other cosmetic products.  Avoid any substance that causes your rash. Keep a journal to help track what causes your rash. Write down: ? What you eat. ? What cosmetic products you use. ? What you drink. ? What you wear. This includes jewelry.  Keep all follow-up visits as told by your health care provider. This is important. Contact a health care provider if:  You sweat at night.  You lose weight.  You urinate more than normal.  You urinate less than normal, or you notice that your urine is a darker color than usual.  You feel weak.  You vomit.  Your skin or the whites of your eyes look yellow (jaundice).  Your skin: ? Tingles. ? Is numb.  Your rash: ? Does not go away after several days. ? Gets worse.  You are: ? Unusually thirsty. ? More tired than normal.  You have: ? New symptoms. ? Pain in your abdomen. ? A fever. ? Diarrhea. Get help right away if you:  Have a fever and your symptoms suddenly get worse.  Develop confusion.  Have a severe headache or a stiff neck.  Have severe joint pains or stiffness.  Have a seizure.  Develop a rash that covers all or most of your body. The rash may or may not be painful.  Develop blisters that: ? Are on top of the rash. ? Grow larger or grow together. ? Are painful. ? Are inside your nose or mouth.  Develop a rash that: ? Looks like purple pinprick-sized spots all over your body. ? Has a "bull's eye" or looks like a target. ? Is not related to sun exposure, is red and painful, and causes your skin to peel. Summary  A rash is a change in the color of your skin. Some rashes disappear after a few days, but some may last for a few weeks.  The goal of treatment is to stop  the itching and keep the rash from spreading.  Take or apply over-the-counter and prescription medicines only as told by your health care provider.  Contact a health care provider if you have new or  worsening symptoms.  Keep all follow-up visits as told by your health care provider. This is important. This information is not intended to replace advice given to you by your health care provider. Make sure you discuss any questions you have with your health care provider. Document Released: 08/13/2002 Document Revised: 03/27/2018 Document Reviewed: 03/27/2018 Elsevier Interactive Patient Education  [employer]2019 [employer]   If you have lab work done today you will be contacted with your lab results within the next 2 weeks.  If you have not heard from us then please contact us. The fastest way to get your results is to register for My Chart.   IF you received an x-ray today, you will receive an invoice from [facility]. Please contact [facility] at [phone] with questions or concerns regarding your invoice.   IF you received labwork today, you will receive an invoice from [facility]. Please contact [facility] at [phone] with questions or concerns regarding your invoice.   Our billing staff will not be able to assist you with questions regarding bills from these companies.  You will be contacted with the lab results as soon as they are available. The fastest way to get your results is to activate your My Chart account. Instructions are located on the last page of this paperwork. If you have not heard from us regarding the results in 2 weeks, please contact this office.          I discussed the assessment and treatment plan with the patient. The patient was provided an opportunity to ask questions and all were answered. The patient agreed with the plan and demonstrated an understanding of the instructions.   The patient was advised to call back or seek an in-person  evaluation if the symptoms worsen or if the condition fails to improve as anticipated.  I provided 15 minutes of non-face-to-face time during this encounter.  Signed,   Meredith StaggersJeffrey Wakeelah Solan, MD Primary Care at [facility].  12/19/18

## 2018-12-19 NOTE — Patient Instructions (Addendum)
As we discussed there are many possible causes of an itchy rash, but I think it is reasonable to try the permethrin cream that was prescribed yesterday to treat for possible scabies.  Can take a Benadryl at night if needed for itching, Zyrtec over-the-counter once per day to help with itching, and I also prescribed some steroid cream that can be used 2 or 3 times per day to affected areas.  If that rash is not improving as the week goes on, or any acute worsening including rash into the genitals, face or mouth, please call for repeat visit as we may need to change plan at that time.  Return to the clinic or go to the nearest emergency room if any of your symptoms worsen or new symptoms occur.   Rash, Adult A rash is a change in the color of your skin. A rash can also change the way your skin feels. There are many different conditions and factors that can cause a rash. Some rashes may disappear after a few days, but some may last for a few weeks. Common causes of rashes include:  Viral infections, such as: ? Colds. ? Measles. ? Hand, foot, and mouth disease.  Bacterial infections, such as: ? Scarlet fever. ? Impetigo.  Fungal infections, such as Candida.  Allergic reactions to food, medicines, or skin care products. Follow these instructions at home: The goal of treatment is to stop the itching and keep the rash from spreading. Pay attention to any changes in your symptoms. Follow these instructions to help with your condition: Medicine Take or apply over-the-counter and prescription medicines only as told by your health care provider. These may include:  Corticosteroid creams to treat red or swollen skin.  Anti-itch lotions.  Oral allergy medicines (antihistamines).  Oral corticosteroids for severe symptoms.  Skin care  Apply cool compresses to the affected areas.  Do not scratch or rub your skin.  Avoid covering the rash. Make sure the rash is exposed to air as much as  possible. Managing itching and discomfort  Avoid hot showers or baths, which can make itching worse. A cold shower may help.  Try taking a bath with: ? Epsom salts. Follow manufacturer instructions on the packaging. You can get these at your local pharmacy or grocery store. ? Baking soda. Pour a small amount into the bath as told by your health care provider. ? Colloidal oatmeal. Follow manufacturer instructions on the packaging. You can get this at your local pharmacy or grocery store.  Try applying baking soda paste to your skin. Stir water into baking soda until it reaches a paste-like consistency.  Try applying calamine lotion. This is an over-the-counter lotion that helps to relieve itchiness.  Keep cool and out of the sun. Sweating and being hot can make itching worse. General instructions   Rest as needed.  Drink enough fluid to keep your urine pale yellow.  Wear loose-fitting clothing.  Avoid scented soaps, detergents, and perfumes. Use gentle soaps, detergents, perfumes, and other cosmetic products.  Avoid any substance that causes your rash. Keep a journal to help track what causes your rash. Write down: ? What you eat. ? What cosmetic products you use. ? What you drink. ? What you wear. This includes jewelry.  Keep all follow-up visits as told by your health care provider. This is important. Contact a health care provider if:  You sweat at night.  You lose weight.  You urinate more than normal.  You urinate less than  normal, or you notice that your urine is a darker color than usual.  You feel weak.  You vomit.  Your skin or the whites of your eyes look yellow (jaundice).  Your skin: ? Tingles. ? Is numb.  Your rash: ? Does not go away after several days. ? Gets worse.  You are: ? Unusually thirsty. ? More tired than normal.  You have: ? New symptoms. ? Pain in your abdomen. ? A fever. ? Diarrhea. Get help right away if you:  Have a fever  and your symptoms suddenly get worse.  Develop confusion.  Have a severe headache or a stiff neck.  Have severe joint pains or stiffness.  Have a seizure.  Develop a rash that covers all or most of your body. The rash may or may not be painful.  Develop blisters that: ? Are on top of the rash. ? Grow larger or grow together. ? Are painful. ? Are inside your nose or mouth.  Develop a rash that: ? Looks like purple pinprick-sized spots all over your body. ? Has a "bull's eye" or looks like a target. ? Is not related to sun exposure, is red and painful, and causes your skin to peel. Summary  A rash is a change in the color of your skin. Some rashes disappear after a few days, but some may last for a few weeks.  The goal of treatment is to stop the itching and keep the rash from spreading.  Take or apply over-the-counter and prescription medicines only as told by your health care provider.  Contact a health care provider if you have new or worsening symptoms.  Keep all follow-up visits as told by your health care provider. This is important. This information is not intended to replace advice given to you by your health care provider. Make sure you discuss any questions you have with your health care provider. Document Released: 08/13/2002 Document Revised: 03/27/2018 Document Reviewed: 03/27/2018 Elsevier Interactive Patient Education  [employer].   If you have lab work done today you will be contacted with your lab results within the next 2 weeks.  If you have not heard from [place] then please contact us. The fastest way to get your results is to register for My Chart.   IF you received an x-ray today, you will receive an invoice from [facility]. Please contact [facility] at [phone] with questions or concerns regarding your invoice.   IF you received labwork today, you will receive an invoice from [place]. Please contact [facility] at [phone]  with questions or concerns regarding your invoice.   Our billing staff will not be able to assist you with questions regarding bills from these companies.  You will be contacted with the lab results as soon as they are available. The fastest way to get your results is to activate your My Chart account. Instructions are located on the last page of this paperwork. If you have not heard from [place] regarding the results in 2 weeks, please contact this office.

## 2018-12-20 MED FILL — SERTRALINE HCL 50 MG TABLET: 50 | 90 days supply | Qty: 90 | Fill #0

## 2018-12-20 MED FILL — SUBVENITE 150 MG TABS: 150 | 90 days supply | Qty: 90 | Fill #0

## 2019-01-03 ENCOUNTER — Encounter: Payer: Self-pay | Admitting: Family Medicine

## 2019-01-03 ENCOUNTER — Ambulatory Visit: Payer: 59 | Admitting: Family Medicine

## 2019-01-03 ENCOUNTER — Other Ambulatory Visit: Payer: Self-pay

## 2019-01-03 VITALS — BP 106/70 | HR 98 | Temp 99.0°F | Resp 18 | Ht 61.0 in | Wt 126.0 lb

## 2019-01-03 DIAGNOSIS — R21 Rash and other nonspecific skin eruption: Secondary | ICD-10-CM

## 2019-01-03 MED ORDER — HYDROXYZINE HCL 25 MG PO TABS: 25.0000 mg | ORAL_TABLET | Freq: Three times a day (TID) | ORAL | 0 refills | Status: DC | PRN

## 2019-01-03 MED FILL — hydrOXYzine HCL 25 MG TABS: 25 | 30 days supply | Qty: 90 | Fill #0

## 2019-01-03 NOTE — Progress Notes (Signed)
4/29/202011:30 AM  Paula Ortiz 04/27/1996, 23 y.o., female [id number]  Chief Complaint  Patient presents with  . scabies    following up and believes its something else very itchy     HPI:   Patient is a 23 y.o. female with past medical history significant for bipolar disorder  who presents today for rash  Started 2 weeks ago Very itchy, started with bumps on her arms, has spread to her back, buttocks and legs Denies any new skin products Denies any lesions in her mouth, palms, soles of feet Nobody else has rash in her household Completed premetharin - did not help Seen 12/18/2018  - premetharin Tele visit 12/19/2018 - triamcinolone, helps some She thinks it might be related to lamictal - but she has been on it for over a year    Fall Risk  01/03/2019 12/19/2018 11/21/2017 11/02/2017 05/25/2017  Falls in the past year? 0 0 No No No  Number falls in past yr: - 0 - - -  Injury with Fall? - 0 - - -  Follow up Falls evaluation completed - - - -     Depression screen [facility] 2/9 01/03/2019 12/19/2018 11/21/2017  Decreased Interest 0 0 2  Down, Depressed, Hopeless 0 0 2  PHQ - 2 Score 0 0 4  Altered sleeping - - 3  Tired, decreased energy - - 3  Change in appetite - - 2  Feeling bad or failure about yourself  - - 2  Trouble concentrating - - 3  Moving slowly or fidgety/restless - - 2  Suicidal thoughts - - 1  PHQ-9 Score - - 20  Difficult doing work/chores - - Extremely dIfficult  Some encounter information is confidential and restricted. Go to Review Flowsheets activity to see all data.    Allergies  Allergen Reactions  . Ibuprofen Hives    Prior to Admission medications   Medication Sig Start Date End Date Taking? Authorizing Provider  sertraline (ZOLOFT) 50 MG tablet Take 1 tablet (50 mg total) by mouth daily. 10/18/18  Yes Arfeen, Phillips Grout, MD  triamcinolone cream (KENALOG) 0.1 % Apply 1 application topically 2 (two) times daily. To affected areas prn. 12/19/18  Yes Shade Flood, MD  lamoTRIgine (LAMICTAL) 150 MG tablet Take 1 tablet (150 mg total) by mouth daily. Patient not taking: Reported on 01/03/2019 10/18/18   Cleotis Nipper, MD    Past Medical History:  Diagnosis Date  . Anxiety   . Bipolar disorder (HCC) 10/19/2018    No past surgical history on file.  Social History   Tobacco Use  . Smoking status: Light Tobacco Smoker    Packs/day: 1.00    Last attempt to quit: 08/16/2017    Years since quitting: 1.3  . Smokeless tobacco: Never Used  Substance Use Topics  . Alcohol use: Yes    Comment: very rare    Family History  Problem Relation Age of Onset  . Cancer Maternal Grandmother     ROS Per hpi  OBJECTIVE:  Today's Vitals   01/03/19 1044  BP: 106/70  Pulse: 98  Resp: 18  Temp: 99 F (37.2 C)  TempSrc: Oral  SpO2: 96%  Weight: 126 lb (57.2 kg)  Height: 5\' 1"  (1.549 m)   Body mass index is 23.81 kg/m.   Physical Exam   Gen: AAOx3, NAD Skin: papular rash, extensor surfaces of hands, arms, legs, back, left shoulder with welt, very excoriated skin.     ASSESSMENT and PLAN  1. Rash and nonspecific skin eruption Discussed with patient that does not seem related to lamictal, however she should discuss with psych. Discussed itch-scratch cycle. Discussed supportive measures. Urticaria? Given welt on exam. Referring to derm for further eval and treatment. Vistaril prn for itchiness. Reviewed med r/se/b.  - Ambulatory referral to Dermatology  Other orders - hydrOXYzine (ATARAX/VISTARIL) 25 MG tablet; Take 1 tablet (25 mg total) by mouth 3 (three) times daily as needed.  Return if symptoms worsen or fail to improve.    Myles LippsIrma M Santiago, MD Primary Care at [facility] [address] [place], [place] [address] Ph.  [phone] Fax [phone]

## 2019-01-03 NOTE — Patient Instructions (Signed)
° ° ° °  If you have lab work done today you will be contacted with your lab results within the next 2 weeks.  If you have not heard from us then please contact us. The fastest way to get your results is to register for My Chart. ° ° °IF you received an x-ray today, you will receive an invoice from [facility]. Please contact [facility] at [phone] with questions or concerns regarding your invoice.  ° °IF you received labwork today, you will receive an invoice from [facility]. Please contact [facility] at [phone] with questions or concerns regarding your invoice.  ° °Our billing staff will not be able to assist you with questions regarding bills from these companies. ° °You will be contacted with the lab results as soon as they are available. The fastest way to get your results is to activate your My Chart account. Instructions are located on the last page of this paperwork. If you have not heard from us regarding the results in 2 weeks, please contact this office. °  ° ° ° °

## 2019-01-17 ENCOUNTER — Encounter (HOSPITAL_COMMUNITY): Payer: Self-pay | Admitting: Psychiatry

## 2019-01-17 ENCOUNTER — Other Ambulatory Visit: Payer: Self-pay

## 2019-01-17 ENCOUNTER — Ambulatory Visit (INDEPENDENT_AMBULATORY_CARE_PROVIDER_SITE_OTHER): Payer: 59 | Admitting: Psychiatry

## 2019-01-17 DIAGNOSIS — F411 Generalized anxiety disorder: Secondary | ICD-10-CM | POA: Diagnosis not present

## 2019-01-17 DIAGNOSIS — F1921 Other psychoactive substance dependence, in remission: Secondary | ICD-10-CM | POA: Diagnosis not present

## 2019-01-17 DIAGNOSIS — F319 Bipolar disorder, unspecified: Secondary | ICD-10-CM

## 2019-01-17 MED ORDER — LAMOTRIGINE 150 MG PO TABS: 150.0000 mg | ORAL_TABLET | Freq: Every day | ORAL | 0 refills | Status: DC

## 2019-01-17 MED ORDER — SERTRALINE HCL 50 MG PO TABS: 50.0000 mg | ORAL_TABLET | Freq: Every day | ORAL | 0 refills | Status: DC

## 2019-01-17 NOTE — Progress Notes (Signed)
Virtual Visit via Telephone Note  I connected with Paula Ortiz on 01/17/19 at 10:40 AM EDT by telephone and verified that I am speaking with the correct person using two identifiers.   I discussed the limitations, risks, security and privacy concerns of performing an evaluation and management service by telephone and the availability of in person appointments. I also discussed with the patient that there may be a patient responsible charge related to this service. The patient expressed understanding and agreed to proceed.   History of Present Illness: Patient was evaluated by phone session.  Patient told she developed a rash on her hand and she was scared that it could be due to Lamictal.  She went to see her primary care physician and after examination she told it is not a Lamictal rash.  Patient do not recall changing her detergents or soap.  After the reassurance from PCP she resume her Lamictal and she is doing fine.  Her rash is slowly and gradually getting better.  She was also prescribed hydroxyzine and treatment for scabies.  Patient finished treatment for scabies but is still taking hydroxyzine at nighttime is helping her sleep.  She feels the rash is not as bad and it is resolving.  She like increase Lamictal as she noticed less irritable, less mood swing and highs and lows.  She is hoping to move out next month to live with his boyfriend.  Patient works from home.  She denies any panic attack and she feels the Zoloft is helping her anxiety.  She is self-employed.  She feels the COVID-19 did not change a lot for her.  Patient does not feel she need to see a therapist.  She reported no tremors.  She denies any paranoia, hallucination.  She feels proud that she is not using drugs and alcohol since 2018.  She had a good support from her parents.  Past Psychiatric History: Viewed. H/O Bipolar since age 75. Saw Dr Yetta Barre at [facility]. Had a good response with Lamictal and Zoloft. Tried  Abilify. No H/O inpatient treatment or any suicidal attempt. H/O cocaine, ETOH and cannabis use but claims to be sober since August 2018.   Observations/Objective: Mental status examination done on the phone.  Patient described her mood good.  Her speech is clear, coherent with normal tone and volume.  Her thought process logical and goal-directed.  Her attention concentration is good.  She denies any auditory or visual hallucination.  She denies any active or passive suicidal thoughts or homicidal thought.  There were no delusions, paranoia.  Her fund of knowledge is adequate.  Her cognition is good.  She is alert and oriented x3.  She reported no tremors.  Assessment and Plan: Bipolar disorder type I.  Generalized anxiety disorder.  Polysubstance dependence in complete remission.  I reviewed records from primary care physician.  She is now taking hydroxyzine 25 mg at bedtime which is helping her sleep and rash is resolving.  Patient and her primary care physician does not feel the rash is related to Lamictal.  She has rash in her hands and did not spread out.  Discussed side effects of Lamictal especially rash.  I recommend that if she rash started to get worse then she need to stop the medicine and see a dermatologist.  Patient does not want to stop or lower the dose at this time since it is working very well for her.  He does not feel to see a therapist.  Continue Zoloft  50 mg daily and Lamictal 150 mg daily.  Recommended to call us back if she has any question, concern or if she feels worsening of the symptom.  Follow-up in 3 months.  Follow Up Instructions:    I discussed the assessment and treatment plan with the patient. The patient was provided an opportunity to ask questions and all were answered. The patient agreed with the plan and demonstrated an understanding of the instructions.   The patient was advised to call back or seek an in-person evaluation if the symptoms worsen or if the  condition fails to improve as anticipated.  I provided 15 minutes of non-face-to-face time during this encounter.   Cleotis NipperSyed T Baylin Gamblin, MD

## 2019-04-20 ENCOUNTER — Other Ambulatory Visit: Payer: Self-pay

## 2019-04-20 ENCOUNTER — Encounter (HOSPITAL_COMMUNITY): Payer: Self-pay | Admitting: Psychiatry

## 2019-04-20 ENCOUNTER — Ambulatory Visit (INDEPENDENT_AMBULATORY_CARE_PROVIDER_SITE_OTHER): Payer: 59 | Admitting: Psychiatry

## 2019-04-20 DIAGNOSIS — F411 Generalized anxiety disorder: Secondary | ICD-10-CM | POA: Diagnosis not present

## 2019-04-20 DIAGNOSIS — F319 Bipolar disorder, unspecified: Secondary | ICD-10-CM | POA: Diagnosis not present

## 2019-04-20 MED ORDER — SERTRALINE HCL 50 MG PO TABS: 75.0000 mg | ORAL_TABLET | Freq: Every day | ORAL | 0 refills | Status: DC

## 2019-04-20 MED ORDER — LAMOTRIGINE 150 MG PO TABS: 150.0000 mg | ORAL_TABLET | Freq: Every day | ORAL | 0 refills | Status: DC

## 2019-04-20 NOTE — Progress Notes (Signed)
Virtual Visit via Telephone Note  I connected with Paula Ortiz on 04/20/19 at  9:40 AM EDT by telephone and verified that I am speaking with the correct person using two identifiers.   I discussed the limitations, risks, security and privacy concerns of performing an evaluation and management service by telephone and the availability of in person appointments. I also discussed with the patient that there may be a patient responsible charge related to this service. The patient expressed understanding and agreed to proceed.   History of Present Illness: Patient was evaluated by phone session.  She admitted recently more anxious and nervous since she moved in with her boyfriend.  Her boyfriend had accident and totaled the car.  She is shocked that how he is alive.  Since she moved out her previous roommate is writing back things on the social media about her.  She admitted feeling anxious and nervous.  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  This is her last semester.  She is also working from home.  She feels sometimes overwhelmed but denies any irritability, severe anger, mood swings or mania.  She is sleeping good.  She likes the Lamictal but is helping her mood but feels anxious and nervous.  She is no longer taking hydroxyzine which was given for the itching by primary care physician.  Her itching is resolved.  She has no more rash.  She is not sure what was the rash as she did tried scabies treatment but likely rash went away on her own.  She did not want to try again hydroxyzine because it was making her very sleepy.  Energy level is good.  Her appetite is okay.  She is sleeping at least 7 to 8 hours.  She had a good support from her parents.  She proud that she has been sober from drugs since 2018.   Past Psychiatric History:Viewed. H/O Bipolarsince age 23. Saw Dr Yetta BarreJones at [facility]. Lamictal and Zoloft worked. Tried Abilify. No H/Oinpatient or  suicidal attempt.H/Ococaine, ETOHand cannabis usebut claims to be sober since August 2018.   Psychiatric Specialty Exam: Physical Exam  ROS  There were no vitals taken for this visit.There is no height or weight on file to calculate BMI.  General Appearance: NA  Eye Contact:  NA  Speech:  Clear and Coherent and Normal Rate  Volume:  Normal  Mood:  Anxious  Affect:  NA  Thought Process:  Goal Directed  Orientation:  Full (Time, Place, and Person)  Thought Content:  Rumination  Suicidal Thoughts:  No  Homicidal Thoughts:  No  Memory:  Immediate;   Good Recent;   Good Remote;   Good  Judgement:  Good  Insight:  Good  Psychomotor Activity:  NA  Concentration:  Concentration: Good and Attention Span: Good  Recall:  Good  Fund of Knowledge:  Good  Language:  Good  Akathisia:  No  Handed:  Right  AIMS (if indicated):     Assets:  Communication Skills Desire for Improvement Housing Resilience Social Support Talents/Skills Transportation  ADL's:  Intact  Cognition:  WNL  Sleep:   fair      Assessment and Plan: Bipolar disorder type I.  Generalized anxiety disorder.  Discussed her recent stress level.  Recommended to try Zoloft 75 mg daily which can help her residual anxiety.  Continue Lamictal 150 mg daily.  She has no rash, itching, tremors or shakes.  I offered therapy but patient feels she  does not need at this time.  Discussed medication side effects and benefits.  Recommended to call us back if she has any question or any concern.  Follow-up in 3 months.  Follow Up Instructions:    I discussed the assessment and treatment plan with the patient. The patient was provided an opportunity to ask questions and all were answered. The patient agreed with the plan and demonstrated an understanding of the instructions.   The patient was advised to call back or seek an in-person evaluation if the symptoms worsen or if the condition fails to improve as anticipated.  I  provided 20 minutes of non-face-to-face time during this encounter.   Kathlee Nations, MD

## 2019-04-23 MED FILL — SUBVENITE 150 MG TABS: 150 | 90 days supply | Qty: 90 | Fill #0

## 2019-04-23 MED FILL — SERTRALINE HCL 50 MG TABLET: 50 | 90 days supply | Qty: 135 | Fill #0

## 2019-05-01 ENCOUNTER — Other Ambulatory Visit: Payer: Self-pay

## 2019-05-01 ENCOUNTER — Encounter: Payer: Self-pay | Admitting: Family Medicine

## 2019-05-01 ENCOUNTER — Ambulatory Visit (INDEPENDENT_AMBULATORY_CARE_PROVIDER_SITE_OTHER): Payer: 59 | Admitting: Family Medicine

## 2019-05-01 VITALS — BP 107/73 | HR 94 | Temp 99.0°F | Ht 61.0 in | Wt 128.0 lb

## 2019-05-01 DIAGNOSIS — Z3042 Encounter for surveillance of injectable contraceptive: Secondary | ICD-10-CM

## 2019-05-01 LAB — POCT URINE PREGNANCY: Preg Test, Ur: NEGATIVE

## 2019-05-01 MED ORDER — MEDROXYPROGESTERONE ACETATE 150 MG/ML IM SUSP
150.0000 mg | Freq: Once | INTRAMUSCULAR | Status: AC
  Administered 2019-05-01: 150 mg via INTRAMUSCULAR

## 2019-05-01 NOTE — Progress Notes (Signed)
Paula Ortiz 

## 2019-05-01 NOTE — Progress Notes (Addendum)
8/25/20204:04 PM  Paula Ortiz 02/19/1996, 23 y.o., female [id number]  Chief Complaint  Patient presents with  . Contraception    missed depo window    HPI:   Patient is a 23 y.o. female with past medical history of bipolar disorder who presents today to restart depo  Last depo given in march 2020 She forgot to place reminder on her phone Has been on depo for several years Period has not returned Has had unprotected sex during this time Has considered other contraception methods and believes depo is very best option   [facility] 01/03/2019 12/19/2018 11/21/2017  Decreased Interest 0 0 2  Down, Depressed, Hopeless 0 0 2  PHQ - 2 Score 0 0 4  Altered sleeping - - 3  Tired, decreased energy - - 3  Change in appetite - - 2  Feeling bad or failure about yourself  - - 2  Trouble concentrating - - 3  Moving slowly or fidgety/restless - - 2  Suicidal thoughts - - 1  PHQ-9 Score - - 20  Difficult doing work/chores - - Extremely dIfficult  Some encounter information is confidential and restricted. Go to Review Flowsheets activity to see all data.    Fall Risk  05/01/2019 01/03/2019 12/19/2018 11/21/2017 11/02/2017  Falls in the past year? 0 0 0 No No  Number falls in past yr: 0 - 0 - -  Injury with Fall? - - 0 - -  Follow up - Falls evaluation completed - - -     Allergies  Allergen Reactions  . Ibuprofen Hives    Prior to Admission medications   Medication Sig Start Date End Date Taking? Authorizing Provider  lamoTRIgine (LAMICTAL) 150 MG tablet Take 1 tablet (150 mg total) by mouth daily. 04/20/19  Yes Arfeen, Phillips GroutSyed T, MD  sertraline (ZOLOFT) 50 MG tablet Take 1.5 tablets (75 mg total) by mouth daily. 04/20/19  Yes Arfeen, Phillips GroutSyed T, MD  triamcinolone cream (KENALOG) 0.1 % Apply 1 application topically 2 (two) times daily. To affected areas prn. 12/19/18  Yes Shade FloodGreene, Jeffrey R, MD    Past Medical History:  Diagnosis Date  . Anxiety   . Bipolar disorder (HCC)  10/19/2018    No past surgical history on file.  Social History   Tobacco Use  . Smoking status: Light Tobacco Smoker    Packs/day: 1.00    Last attempt to quit: 08/16/2017    Years since quitting: 1.7  . Smokeless tobacco: Never Used  Substance Use Topics  . Alcohol use: Yes    Comment: very rare    Family History  Problem Relation Age of Onset  . Cancer Maternal Grandmother     ROS Per hpi  OBJECTIVE:  Today's Vitals   05/01/19 1538  BP: 107/73  Pulse: 94  Temp: 99 F (37.2 C)  TempSrc: Oral  SpO2: 100%  Weight: 128 lb (58.1 kg)  Height: 5\' 1"  (1.549 m)   Body mass index is 24.19 kg/m.   Physical Exam Vitals signs and nursing note reviewed.  Constitutional:      Appearance: She is well-developed.  HENT:     Head: Normocephalic and atraumatic.  Eyes:     General: No scleral icterus.    Conjunctiva/sclera: Conjunctivae normal.     Pupils: Pupils are equal, round, and reactive to light.  Neck:     Musculoskeletal: Neck supple.  Pulmonary:     Effort: Pulmonary effort is normal.  Skin:    General:  Skin is warm and dry.  Neurological:     Mental Status: She is alert and oriented to person, place, and time.     Results for orders placed or performed in visit on 05/01/19 (from the past 24 hour(s))  POCT urine pregnancy     Status: Normal   Collection Time: 05/01/19  3:44 PM  Result Value Ref Range   Preg Test, Ur Negative Negative    No results found.   ASSESSMENT and PLAN  1. Encounter for surveillance of injectable contraceptive Next depo due Nov 10 - Dec 8, nurse visit ok - POCT urine pregnancy - medroxyPROGESTERone (DEPO-PROVERA) injection 150 mg  Return for Nov 10 - Dec 8, for depo nurse visit.    Rutherford Guys, MD [facility] [place], [place] [address] Ph.  [phone] Fax [phone]

## 2019-05-01 NOTE — Patient Instructions (Addendum)
    Next depo due Nov 10 - Dec 8, nurse visit ok Repeat pregnancy test in 2 weeks  If you have lab work done today you will be contacted with your lab results within the next 2 weeks.  If you have not heard from [place] then please contact us. The fastest way to get your results is to register for My Chart.   IF you received an x-ray today, you will receive an invoice from [facility]. Please contact [facility] at [phone] with questions or concerns regarding your invoice.   IF you received labwork today, you will receive an invoice from [facility]. Please contact [facility] at [phone] with questions or concerns regarding your invoice.   Our billing staff will not be able to assist you with questions regarding bills from these companies.  You will be contacted with the lab results as soon as they are available. The fastest way to get your results is to activate your My Chart account. Instructions are located on the last page of this paperwork. If you have not heard from [place] regarding the results in 2 weeks, please contact this office.

## 2019-07-16 ENCOUNTER — Ambulatory Visit (INDEPENDENT_AMBULATORY_CARE_PROVIDER_SITE_OTHER): Payer: 59 | Admitting: Psychiatry

## 2019-07-16 ENCOUNTER — Encounter (HOSPITAL_COMMUNITY): Payer: Self-pay | Admitting: Psychiatry

## 2019-07-16 ENCOUNTER — Other Ambulatory Visit: Payer: Self-pay

## 2019-07-16 DIAGNOSIS — F411 Generalized anxiety disorder: Secondary | ICD-10-CM | POA: Diagnosis not present

## 2019-07-16 DIAGNOSIS — F319 Bipolar disorder, unspecified: Secondary | ICD-10-CM

## 2019-07-16 MED ORDER — LAMOTRIGINE 200 MG PO TABS: 200.0000 mg | ORAL_TABLET | Freq: Every day | ORAL | 0 refills | Status: DC

## 2019-07-16 MED ORDER — SERTRALINE HCL 50 MG PO TABS: 75.0000 mg | ORAL_TABLET | Freq: Every day | ORAL | 0 refills | Status: DC

## 2019-07-16 MED FILL — SUBVENITE 200 MG TABS: 200 | 90 days supply | Qty: 90 | Fill #0

## 2019-07-16 MED FILL — SERTRALINE HCL 50 MG TABLET: 50 | 90 days supply | Qty: 135 | Fill #0

## 2019-07-16 NOTE — Progress Notes (Signed)
Virtual Visit via Telephone Note  I connected with Tekeisha Hakim on 07/16/19 at  9:00 AM EST by telephone and verified that I am speaking with the correct person using two identifiers.   I discussed the limitations, risks, security and privacy concerns of performing an evaluation and management service by telephone and the availability of in person appointments. I also discussed with the patient that there may be a patient responsible charge related to this service. The patient expressed understanding and agreed to proceed.   History of Present Illness: Patient was evaluated by phone session.  She admitted recently noticed irritability, sadness, lack of motivation to do things.  She also noticed sometimes she cries but denies any suicidal thoughts and homicidal thought.  She is taking semester off once she find out that she has to take more classes and not able to finish the school in a timely fashion.  She is hoping to resume school in January.  She has no more itching and rash.  She believes the rash and itching could be due to bedbugs when she moved into the new place.  She is sleeping fairly well but endorsed sadness and dysphoric mood.  She is taking Lamictal 150 mg and we have increase Zoloft to 75 mg in the past.  She is not sure if the increase Zoloft has caused irritability or sadness.  Her level is fair.  Her appetite is okay.  She is not drinking or using any drugs.  She has a good support from her parents.  She lives with her boyfriend and reported things are going well.   Past Psychiatric History:Viewed. H/O Bipolarsince age 5. Saw Dr Ronnald Ramp at [facility]. Lamictal and Zoloft worked. TriedAbilify. No H/Oinpatient or suicidal attempt.H/Ococaine, ETOHand cannabis usebut claims to be sober since August 2018.   Psychiatric Specialty Exam: Physical Exam  ROS  There were no vitals taken for this visit.There is no height or weight on file to calculate BMI.  General Appearance:  NA  Eye Contact:  NA  Speech:  Clear and Coherent  Volume:  Normal  Mood:  Dysphoric and Irritable  Affect:  NA  Thought Process:  Goal Directed  Orientation:  Full (Time, Place, and Person)  Thought Content:  Rumination  Suicidal Thoughts:  No  Homicidal Thoughts:  No  Memory:  Immediate;   Good Recent;   Good Remote;   Good  Judgement:  Good  Insight:  Good  Psychomotor Activity:  NA  Concentration:  Concentration: Good and Attention Span: Good  Recall:  Good  Fund of Knowledge:  Good  Language:  Good  Akathisia:  No  Handed:  Right  AIMS (if indicated):     Assets:  Communication Skills Desire for Improvement Resilience Social Support  ADL's:  Intact  Cognition:  WNL  Sleep:   ok      Assessment and Plan: Bipolar disorder type I.  Generalized anxiety disorder.  We have increase Zoloft 75 mg recently and she is not sure if that has caused irritability, sadness or crying spells.  I recommend to try Lamictal 200 mg and if she still feels the same then cut down the Zoloft to 50 mg to the original dose.  Reminded that if Lamictal causes any rash itching then she need to stop the medicine immediately.  At this time she is not interested in therapy.  Discussed medication side effects and benefits.  Recommended to call us back if she has any question or any concern.  Follow-up in 3 months.  Follow Up Instructions:    I discussed the assessment and treatment plan with the patient. The patient was provided an opportunity to ask questions and all were answered. The patient agreed with the plan and demonstrated an understanding of the instructions.   The patient was advised to call back or seek an in-person evaluation if the symptoms worsen or if the condition fails to improve as anticipated.  I provided 20 minutes of non-face-to-face time during this encounter.   Kathlee Nations, MD

## 2019-07-25 MED FILL — SUBVENITE 200 MG TABS: 200 | 90 days supply | Qty: 90 | Fill #0

## 2019-07-25 MED FILL — SERTRALINE HCL 50 MG TABLET: 50 | 90 days supply | Qty: 135 | Fill #0

## 2019-08-01 ENCOUNTER — Ambulatory Visit: Payer: 59

## 2019-08-01 ENCOUNTER — Other Ambulatory Visit: Payer: Self-pay

## 2019-08-01 ENCOUNTER — Telehealth: Payer: Self-pay

## 2019-08-01 NOTE — Telephone Encounter (Signed)
Pt came is for birth control injection, no inj in stock. Supervisor was notified. Pt understood, she will cll before she returns for her shot

## 2019-08-06 ENCOUNTER — Ambulatory Visit (INDEPENDENT_AMBULATORY_CARE_PROVIDER_SITE_OTHER): Payer: 59 | Admitting: Family Medicine

## 2019-08-06 ENCOUNTER — Other Ambulatory Visit: Payer: Self-pay

## 2019-08-06 VITALS — Temp 97.9°F

## 2019-08-06 DIAGNOSIS — Z3042 Encounter for surveillance of injectable contraceptive: Secondary | ICD-10-CM | POA: Diagnosis not present

## 2019-08-06 DIAGNOSIS — Z789 Other specified health status: Secondary | ICD-10-CM

## 2019-08-06 NOTE — Progress Notes (Signed)
   LUOQ medication tolerated well.   Next due on 2/15-11/05/2018  NDC: 7510-2585-27 LOT: [id number] Exp: 03/2021

## 2019-10-08 ENCOUNTER — Ambulatory Visit (INDEPENDENT_AMBULATORY_CARE_PROVIDER_SITE_OTHER): Payer: 59 | Admitting: Psychiatry

## 2019-10-08 ENCOUNTER — Encounter (HOSPITAL_COMMUNITY): Payer: Self-pay | Admitting: Psychiatry

## 2019-10-08 ENCOUNTER — Other Ambulatory Visit: Payer: Self-pay

## 2019-10-08 DIAGNOSIS — F319 Bipolar disorder, unspecified: Secondary | ICD-10-CM | POA: Diagnosis not present

## 2019-10-08 DIAGNOSIS — F411 Generalized anxiety disorder: Secondary | ICD-10-CM | POA: Diagnosis not present

## 2019-10-08 MED ORDER — LAMOTRIGINE 200 MG PO TABS: 200.0000 mg | ORAL_TABLET | Freq: Every day | ORAL | 0 refills | Status: DC

## 2019-10-08 MED ORDER — SERTRALINE HCL 100 MG PO TABS: 100.0000 mg | ORAL_TABLET | Freq: Every day | ORAL | 0 refills | Status: DC

## 2019-10-08 MED FILL — SERTRALINE HCL 100 MG TAB: 100 | 90 days supply | Qty: 90 | Fill #0

## 2019-10-08 NOTE — Progress Notes (Signed)
Virtual Visit via Telephone Note  I connected with Paula Ortiz on 10/08/19 at 11:20 AM EST by telephone and verified that I am speaking with the correct person using two identifiers.   I discussed the limitations, risks, security and privacy concerns of performing an evaluation and management service by telephone and the availability of in person appointments. I also discussed with the patient that there may be a patient responsible charge related to this service. The patient expressed understanding and agreed to proceed.   History of Present Illness: Patient was evaluated by phone session.  On the last visit we increase Lamictal 200 mg as she has noticed lack of motivation, crying spells, irritability.  She endorsed much improvement in her crying spells and irritability but also taken few times Zoloft 100 mg and she noticed improvement by taking 100 mg.  She like to see if she can continue Zoloft 100 mg instead of 75 mg.  She regret not able to register herself for January classes but she is hoping to start school in summer.  Her relationship with the boyfriend is going well.  She reported Christmas was good.  She has no rash, itching, tremors or shakes.  She denies any highs and lows and mania.  She denies any active suicidal thoughts.  Currently level is good.  Her appetite is okay.  She sleeps most of the night very well.  She denies drinking or using any illegal substances.  She is working from home.  She had a good support from her parents.   Past Psychiatric History:Viewed. H/O Bipolarsince age 17. Saw Dr Yetta Barre at [facility]. Lamictal and Zoloftworked. TriedAbilify. No h/o inpatientorsuicidal attempt.H/Ococaine, ETOHand THC but claims to be sober since 2018.   Psychiatric Specialty Exam: Physical Exam  Review of Systems  There were no vitals taken for this visit.There is no height or weight on file to calculate BMI.  General Appearance: NA  Eye Contact:  NA  Speech:   Clear and Coherent  Volume:  Normal  Mood:  Euthymic  Affect:  NA  Thought Process:  Goal Directed  Orientation:  Full (Time, Place, and Person)  Thought Content:  WDL  Suicidal Thoughts:  No  Homicidal Thoughts:  No  Memory:  Immediate;   Good Recent;   Good Remote;   Good  Judgement:  Good  Insight:  Present  Psychomotor Activity:  NA  Concentration:  Concentration: Good and Attention Span: Good  Recall:  Good  Fund of Knowledge:  Good  Language:  Good  Akathisia:  No  Handed:  Right  AIMS (if indicated):     Assets:  Communication Skills Desire for Improvement Housing Resilience Social Support  ADL's:  Intact  Cognition:  WNL  Sleep:   ok      Assessment and Plan: Bipolar disorder type I.  Generalized anxiety disorder.   Discuss not to take higher dose of Zoloft unless discussed but patient seems to be doing better on Zoloft 100 mg we will recommend to continue Zoloft 100 mg daily.  Patient also promised that she will call us if she has any question about the medication.  Continue Lamictal 200 mg daily as she has noticed her mood irritability is much better and she has no longer crying spells.  She is not interested in therapy.  Recommended to call us back if she has any question or any concern.  Discussed side effect specially rash with the Lamictal and in that case she need to stop the  medicine immediately.  Follow-up in 3 months.   Follow Up Instructions:    I discussed the assessment and treatment plan with the patient. The patient was provided an opportunity to ask questions and all were answered. The patient agreed with the plan and demonstrated an understanding of the instructions.   The patient was advised to call back or seek an in-person evaluation if the symptoms worsen or if the condition fails to improve as anticipated.  I provided 20 minutes of non-face-to-face time during this encounter.   Kathlee Nations, MD

## 2019-11-14 ENCOUNTER — Other Ambulatory Visit: Payer: Self-pay

## 2019-11-14 ENCOUNTER — Ambulatory Visit (INDEPENDENT_AMBULATORY_CARE_PROVIDER_SITE_OTHER): Payer: 59 | Admitting: Family Medicine

## 2019-11-14 DIAGNOSIS — Z789 Other specified health status: Secondary | ICD-10-CM

## 2019-11-14 DIAGNOSIS — Z3042 Encounter for surveillance of injectable contraceptive: Secondary | ICD-10-CM

## 2019-11-14 DIAGNOSIS — Z111 Encounter for screening for respiratory tuberculosis: Secondary | ICD-10-CM

## 2019-11-14 LAB — POCT URINE PREGNANCY: Preg Test, Ur: NEGATIVE

## 2019-11-14 NOTE — Progress Notes (Signed)
Pregnancy test taken and it was    Left Ventrogluteal medication tolerated well.   Next due on 01/30/2020-02/13/2020  NDC: 79444-619-01 LOT: [id number] Exp: 12/2020

## 2019-11-14 NOTE — Patient Instructions (Signed)
° ° ° °  If you have lab work done today you will be contacted with your lab results within the next 2 weeks.  If you have not heard from us then please contact us. The fastest way to get your results is to register for My Chart. ° ° °IF you received an x-ray today, you will receive an invoice from [facility]. Please contact [facility] at [phone] with questions or concerns regarding your invoice.  ° °IF you received labwork today, you will receive an invoice from [facility]. Please contact [facility] at [phone] with questions or concerns regarding your invoice.  ° °Our billing staff will not be able to assist you with questions regarding bills from these companies. ° °You will be contacted with the lab results as soon as they are available. The fastest way to get your results is to activate your My Chart account. Instructions are located on the last page of this paperwork. If you have not heard from us regarding the results in 2 weeks, please contact this office. °  ° ° ° °

## 2019-11-16 DIAGNOSIS — Z3042 Encounter for surveillance of injectable contraceptive: Secondary | ICD-10-CM | POA: Diagnosis not present

## 2019-11-16 DIAGNOSIS — Z111 Encounter for screening for respiratory tuberculosis: Secondary | ICD-10-CM | POA: Diagnosis not present

## 2019-11-23 MED FILL — SUBVENITE 200 MG TABS: 200 | 90 days supply | Qty: 90 | Fill #0

## 2019-11-23 MED FILL — SERTRALINE HCL 100 MG TAB: 100 | 90 days supply | Qty: 90 | Fill #0

## 2020-01-07 ENCOUNTER — Other Ambulatory Visit: Payer: Self-pay

## 2020-01-07 ENCOUNTER — Telehealth (INDEPENDENT_AMBULATORY_CARE_PROVIDER_SITE_OTHER): Payer: 59 | Admitting: Psychiatry

## 2020-01-07 DIAGNOSIS — F411 Generalized anxiety disorder: Secondary | ICD-10-CM | POA: Diagnosis not present

## 2020-01-07 DIAGNOSIS — F319 Bipolar disorder, unspecified: Secondary | ICD-10-CM

## 2020-01-07 MED ORDER — LAMOTRIGINE 200 MG PO TABS: 200.0000 mg | ORAL_TABLET | Freq: Every day | ORAL | 0 refills | Status: DC

## 2020-01-07 MED ORDER — SERTRALINE HCL 100 MG PO TABS: 100.0000 mg | ORAL_TABLET | Freq: Every day | ORAL | 0 refills | Status: DC

## 2020-01-07 NOTE — Progress Notes (Signed)
Virtual Visit via Telephone Note  I connected with Paula Ortiz on 01/07/20 at 10:40 AM EDT by telephone and verified that I am speaking with the correct person using two identifiers.   I discussed the limitations, risks, security and privacy concerns of performing an evaluation and management service by telephone and the availability of in person appointments. I also discussed with the patient that there may be a patient responsible charge related to this service. The patient expressed understanding and agreed to proceed.   History of Present Illness: Patient is evaluated by phone session.  She is taking Lamictal and Zoloft.  So far she feels the medicine is helping and she denies any irritability, mood swings, depression or anxiety.  However she like to change her diagnosis and wondering if she have underlying ADHD.  I discussed her symptoms about her mood lability but she denies ever being having mania.  I recommend she should do a psychological testing to rule out ADHD.  She agreed with the plan.  She is sleeping better but she is still sometimes having racing thoughts.  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  Her appetite is okay.  Her energy level is good.  She denies any crying spells or any feeling of hopelessness or suicidal thoughts.  She is taking a break from school because of COVID would like to go back when things get back to normal.  Past Psychiatric History:Viewed. H/O Bipolarsince age 24. Saw Dr Yetta Barre at [facility]. Lamictal and Zoloftworked. TriedAbilify. No h/o inpatientorsuicidal attempt.H/Ococaine, ETOHand THC but claims to be sober since 2018.   Psychiatric Specialty Exam: Physical Exam  Review of Systems  There were no vitals taken for this visit.There is no height or weight on file to calculate BMI.  General Appearance: NA  Eye Contact:  NA  Speech:  Clear and Coherent  Volume:  Normal  Mood:  Euthymic   Affect:  NA  Thought Process:  Goal Directed  Orientation:  Full (Time, Place, and Person)  Thought Content:  WDL  Suicidal Thoughts:  No  Homicidal Thoughts:  No  Memory:  Immediate;   Good Recent;   Good Remote;   Good  Judgement:  Intact  Insight:  Present  Psychomotor Activity:  NA  Concentration:  Concentration: Good and Attention Span: Good  Recall:  Good  Fund of Knowledge:  Good  Language:  Good  Akathisia:  No  Handed:  Right  AIMS (if indicated):     Assets:  Communication Skills Desire for Improvement Housing Resilience Social Support Talents/Skills Transportation  ADL's:  Intact  Cognition:  WNL  Sleep:   ok      Assessment and Plan: Bipolar disorder type I.  Generalized anxiety disorder.  Recommended to have a psychological testing to establish the diagnosis of ADHD.  Patient agreed with the plan.  We will provide names and contact information testing centers.  For now continue Zoloft 100 mg daily and Lamictal 200 mg daily.  She has no rash, itching tremors or shakes.  Recommended to call us back if she has any questions or any concerns.  Follow-up in 3 months.  Follow Up Instructions:    I discussed the assessment and treatment plan with the patient. The patient was provided an opportunity to ask questions and all were answered. The patient agreed with the plan and demonstrated an understanding of the instructions.   The patient was advised to call back or seek  an in-person evaluation if the symptoms worsen or if the condition fails to improve as anticipated.  I provided 20 minutes of non-face-to-face time during this encounter.   Kathlee Nations, MD

## 2020-01-09 ENCOUNTER — Other Ambulatory Visit (HOSPITAL_COMMUNITY): Payer: Self-pay

## 2020-01-09 DIAGNOSIS — F319 Bipolar disorder, unspecified: Secondary | ICD-10-CM

## 2020-01-09 NOTE — Progress Notes (Signed)
Psych testing to rule out ADHD

## 2020-01-18 ENCOUNTER — Ambulatory Visit (INDEPENDENT_AMBULATORY_CARE_PROVIDER_SITE_OTHER): Payer: 59 | Admitting: Family Medicine

## 2020-01-18 ENCOUNTER — Encounter: Payer: Self-pay | Admitting: Family Medicine

## 2020-01-18 ENCOUNTER — Other Ambulatory Visit: Payer: Self-pay

## 2020-01-18 VITALS — BP 104/78 | HR 103 | Temp 98.2°F | Ht 61.0 in | Wt 128.5 lb

## 2020-01-18 DIAGNOSIS — M7062 Trochanteric bursitis, left hip: Secondary | ICD-10-CM

## 2020-01-18 DIAGNOSIS — L84 Corns and callosities: Secondary | ICD-10-CM

## 2020-01-18 DIAGNOSIS — M7061 Trochanteric bursitis, right hip: Secondary | ICD-10-CM

## 2020-01-18 DIAGNOSIS — M21612 Bunion of left foot: Secondary | ICD-10-CM | POA: Diagnosis not present

## 2020-01-18 NOTE — Patient Instructions (Addendum)
Take aleve (naproxyn) 220  Mg 2 pills twice daily for a couple of weeks and see if it will calm down the inflammation for you.  You said you can take this, but if you get any has or itching discontinue it immediately.  If the trochanteric bursitis continues to give you too much problems, we should refer you to a sports medicine doctor for further evaluation.  If the bunion keeps bothering you too much, you will need to see a podiatrist.  In the meantime I would recommend making sure that you wear shoes that give plenty of toe space.  The anti-inflammatory medication should help this some.  Try and note if there is any metal that rubs against the outer aspect of your ankles where you have the rash.  You can also try some over-the-counter hydrocortisone cream 1% a couple of times a day for a few weeks and see if that will help it to fade away.  This appears to me to possibly be a nickel allergy which is why I ask you to check for any metal on any of your shoes.  Return as needed   Bunion  A bunion is a bump on the base of the big toe that forms when the bones of the big toe joint move out of position. Bunions may be small at first, but they often get larger over time. They can make walking painful. What are the causes? A bunion may be caused by:  Wearing narrow or pointed shoes that force the big toe to press against the other toes.  Abnormal foot development that causes the foot to roll inward (pronate).  Changes in the foot that are caused by certain diseases, such as rheumatoid arthritis or polio.  A foot injury. What increases the risk? The following factors may make you more likely to develop this condition:  Wearing shoes that squeeze the toes together.  Having certain diseases, such as: ? Rheumatoid arthritis. ? Polio. ? Cerebral palsy.  Having family members who have bunions.  Being born with a foot deformity, such as flat feet or low arches.  Doing activities that put a  lot of pressure on the feet, such as ballet dancing. What are the signs or symptoms? The main symptom of a bunion is a noticeable bump on the big toe. Other symptoms may include:  Pain.  Swelling around the big toe.  Redness and inflammation.  Thick or hardened skin on the big toe or between the toes.  Stiffness or loss of motion in the big toe.  Trouble with walking. How is this diagnosed? A bunion may be diagnosed based on your symptoms, medical history, and activities. You may have tests, such as:  X-rays. These allow your health care provider to check the position of the bones in your foot and look for damage to your joint. They also help your health care provider determine the severity of your bunion and the best way to treat it.  Joint aspiration. In this test, a sample of fluid is removed from the toe joint. This test may be done if you are in a lot of pain. It helps rule out diseases that cause painful swelling of the joints, such as arthritis. How is this treated? Treatment depends on the severity of your symptoms. The goal of treatment is to relieve symptoms and prevent the bunion from getting worse. Your health care provider may recommend:  Wearing shoes that have a wide toe box.  Using bunion pads  to cushion the affected area.  Taping your toes together to keep them in a normal position.  Placing a device inside your shoe (orthotics) to help reduce pressure on your toe joint.  Taking medicine to ease pain, inflammation, and swelling.  Applying heat or ice to the affected area.  Doing stretching exercises.  Surgery to remove scar tissue and move the toes back into their normal position. This treatment is rare. Follow these instructions at home: Managing pain, stiffness, and swelling   If directed, put ice on the painful area: ? Put ice in a plastic bag. ? Place a towel between your skin and the bag. ? Leave the ice on for 20 minutes, 2-3 times a  day. Activity   If directed, apply heat to the affected area before you exercise. Use the heat source that your health care provider recommends, such as a moist heat pack or a heating pad. ? Place a towel between your skin and the heat source. ? Leave the heat on for 20-30 minutes. ? Remove the heat if your skin turns bright red. This is especially important if you are unable to feel pain, heat, or cold. You may have a greater risk of getting burned.  Do exercises as told by your health care provider. General instructions  Support your toe joint with proper footwear, shoe padding, or taping as told by your health care provider.  Take over-the-counter and prescription medicines only as told by your health care provider.  Keep all follow-up visits as told by your health care provider. This is important. Contact a health care provider if your symptoms:  Get worse.  Do not improve in 2 weeks. Get help right away if you have:  Severe pain and trouble with walking. Summary  A bunion is a bump on the base of the big toe that forms when the bones of the big toe joint move out of position.  Bunions can make walking painful.  Treatment depends on the severity of your symptoms.  Support your toe joint with proper footwear, shoe padding, or taping as told by your health care provider. This information is not intended to replace advice given to you by your health care provider. Make sure you discuss any questions you have with your health care provider. Document Revised: 02/27/2018 Document Reviewed: 01/03/2018 Elsevier Patient Education  2020 [employer]  Hip Bursitis  Hip bursitis is inflammation of a fluid-filled sac (bursa) in the hip joint. The bursa prevents the bones in the hip joint from rubbing against each other. Hip bursitis can cause mild to moderate pain, and symptoms often come and go over time. What are the causes? This condition may be caused by:  Injury to the  hip.  Overuse of the muscles that surround the hip joint.  Previous injury or surgery of the hip.  Arthritis or gout.  Diabetes.  Thyroid disease.  Infection. In some cases, the cause may not be known. What are the signs or symptoms? Symptoms of this condition include:  Mild or moderate pain in the hip area. Pain may get worse with movement.  Tenderness and swelling of the hip, especially on the outer side of the hip.  In rare cases, the bursa may become infected. This may cause a fever, as well as warmth and redness in the area. Symptoms may come and go. How is this diagnosed? This condition may be diagnosed based on:  A physical exam.  Your medical history.  X-rays.  Removal of  fluid from your inflamed bursa for testing (biopsy). You may be sent to a health care provider who specializes in bone diseases (orthopedist) or a provider who specializes in joint inflammation (rheumatologist). How is this treated? This condition is treated by resting, icing, applying pressure (compression), and raising (elevating) the injured area. This is called RICE treatment. In some cases, this may be enough to make your symptoms go away. Treatment may also include:  Using crutches.  Draining fluid out of the bursa to help relieve swelling.  Injecting medicine that helps to reduce inflammation (cortisone).  Additional medicines if the bursa is infected. Follow these instructions at home: Managing pain, stiffness, and swelling   If directed, put ice on the painful area. ? Put ice in a plastic bag. ? Place a towel between your skin and the bag. ? Leave the ice on for 20 minutes, 2-3 times a day. ? Raise (elevate) your hip above the level of your heart as much as you can without pain. To do this, try putting a pillow under your hips while you lie down. Activity  Return to your normal activities as told by your health care provider. Ask your health care provider what activities are  safe for you.  Rest and protect your hip as much as possible until your pain and swelling get better. General instructions  Take over-the-counter and prescription medicines only as told by your health care provider.  Wear compression wraps only as told by your health care provider.  Do not use your hip to support your body weight until your health care provider says that you can. Use crutches as told by your health care provider.  Gently massage and stretch your injured area as often as is comfortable.  Keep all follow-up visits as told by your health care provider. This is important. How is this prevented?  Exercise regularly, as told by your health care provider.  Warm up and stretch before being active.  Cool down and stretch after being active.  If an activity irritates your hip or causes pain, avoid the activity as much as possible.  Avoid sitting down for long periods at a time. Contact a health care provider if you:  Have a fever.  Develop new symptoms.  Have difficulty walking or doing everyday activities.  Have pain that gets worse or does not get better with medicine.  Develop red skin or a feeling of warmth in your hip area. Get help right away if you:  Cannot move your hip.  Have severe pain. Summary  Hip bursitis is inflammation of a fluid-filled sac (bursa) in the hip joint.  Hip bursitis can cause mild to moderate pain, and symptoms often come and go over time.  This condition is treated with rest, ice, compression, elevation, and medicines. This information is not intended to replace advice given to you by your health care provider. Make sure you discuss any questions you have with your health care provider. Document Revised: 05/01/2018 Document Reviewed: 05/01/2018 Elsevier Patient Education  [employer].     If you have lab work done today you will be contacted with your lab results within the next 2 weeks.  If you have not heard from [place]  then please contact us. The fastest way to get your results is to register for My Chart.   IF you received an x-ray today, you will receive an invoice from [facility]. Please contact [facility] at [phone] with questions or concerns regarding your invoice.  IF you received labwork today, you will receive an invoice from [facility]. Please contact [facility] at [phone] with questions or concerns regarding your invoice.   Our billing staff will not be able to assist you with questions regarding bills from these companies.  You will be contacted with the lab results as soon as they are available. The fastest way to get your results is to activate your My Chart account. Instructions are located on the last page of this paperwork. If you have not heard from [place] regarding the results in 2 weeks, please contact this office.

## 2020-01-18 NOTE — Progress Notes (Signed)
Patient ID: Paula Ortiz, female    DOB: [birth year]  Age: 24 y.o. MRN: [id number]  Chief Complaint  Patient presents with  . hip and back pain    ongoing 5-6 years. flair ups more often lately more on right side. radiates down to knees  . left foot bumion/ corn    start to hurt worse since working more     Subjective:   24 year old lady who is here for couple things.  She has been having problems with pain from her low back upper hip area down toward her knees.  This has bothered her off and on for a number of years, but has been worse lately.  Knows of no specific injury.  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  She does not do a lot of physical exercise but does do a lot of stretching.  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  She also had problems with a bunion on her left foot.  Incidentally she did have a injury and fracture of the left second metatarsal some years ago.  She has a little corn which is developed in the medial aspect of the fifth toe of the left foot.  She has been able to get rid of it but it comes back.  She has a little nonspecific rash on the ankles anterior to the lateral malleoli.  She thought it might be from how she gets down cleaning floors and things.  Does not know of metal allergy rubbing on there.  Current allergies, medications, problem list, past/family and social histories reviewed.  She does have a history of allergy to ibuprofen which caused hives but she has been able to take Tylenol and Aleve in the past.  I explained to her that the Aleve was cousin of ibuprofen, but we need to be cautious with other medicines in the NSAID class.  Objective:  BP 104/78   Pulse (!) 103   Temp 98.2 F (36.8 C) (Temporal)   Ht 5\' 1"  (1.549 m)   Wt 128 lb 8 oz (58.3 kg)   SpO2 93%   BMI 24.28 kg/m  No major acute distress.  Healthy appearing young lady.  No tenderness in the lumbar spine or paraspinous muscles.  Good flexion of the back.   She is a little tender on both greater trochanters, left more than the right.  She has normal straight leg raising.  Good pedal pulses.  She does have a moderate sized bunion of the left first MTP joint and a small corn on the medial aspect of the left fifth toe.  Both ankles have some discolored thickened skin anterior to the lateral malleoli which looks like a little chronic dermatitis.  It is not itching.  He does have a history of eczema.  Assessment & Plan:   Assessment: No diagnosis found.    Plan: See instructions.  I do not believe that x-rays are needed at this time.  If she keeps having problems may need to refer to a sports medicine person to work with the bursitis, and her podiatrist for better direction of what to do with that left foot.  It may be related to the fact that she had the injury of it many years ago.  No orders of the defined types were placed in this encounter.   No orders of the defined types were placed in this encounter.        Patient Instructions  Take aleve (naproxyn) 220  Mg 2 pills twice daily for a couple of weeks and see if it will calm down the inflammation for you.  You said you can take this, but if you get any has or itching discontinue it immediately.  If the trochanteric bursitis continues to give you too much problems, we should refer you to a sports medicine doctor for further evaluation.  If the bunion keeps bothering you too much, you will need to see a podiatrist.  In the meantime I would recommend making sure that you wear shoes that give plenty of toe space.  The anti-inflammatory medication should help this some.  Try and note if there is any metal that rubs against the outer aspect of your ankles where you have the rash.  You can also try some over-the-counter hydrocortisone cream 1% a couple of times a day for a few weeks and see if that will help it to fade away.  This appears to me to possibly be a nickel allergy which is why I  ask you to check for any metal on any of your shoes.  Return as needed   Bunion  A bunion is a bump on the base of the big toe that forms when the bones of the big toe joint move out of position. Bunions may be small at first, but they often get larger over time. They can make walking painful. What are the causes? A bunion may be caused by:  Wearing narrow or pointed shoes that force the big toe to press against the other toes.  Abnormal foot development that causes the foot to roll inward (pronate).  Changes in the foot that are caused by certain diseases, such as rheumatoid arthritis or polio.  A foot injury. What increases the risk? The following factors may make you more likely to develop this condition:  Wearing shoes that squeeze the toes together.  Having certain diseases, such as: ? Rheumatoid arthritis. ? Polio. ? Cerebral palsy.  Having family members who have bunions.  Being born with a foot deformity, such as flat feet or low arches.  Doing activities that put a lot of pressure on the feet, such as ballet dancing. What are the signs or symptoms? The main symptom of a bunion is a noticeable bump on the big toe. Other symptoms may include:  Pain.  Swelling around the big toe.  Redness and inflammation.  Thick or hardened skin on the big toe or between the toes.  Stiffness or loss of motion in the big toe.  Trouble with walking. How is this diagnosed? A bunion may be diagnosed based on your symptoms, medical history, and activities. You may have tests, such as:  X-rays. These allow your health care provider to check the position of the bones in your foot and look for damage to your joint. They also help your health care provider determine the severity of your bunion and the best way to treat it.  Joint aspiration. In this test, a sample of fluid is removed from the toe joint. This test may be done if you are in a lot of pain. It helps rule out diseases  that cause painful swelling of the joints, such as arthritis. How is this treated? Treatment depends on the severity of your symptoms. The goal of treatment is to relieve symptoms and prevent the bunion from getting worse. Your health care provider may recommend:  Wearing shoes that have a wide toe box.  Using bunion  pads to cushion the affected area.  Taping your toes together to keep them in a normal position.  Placing a device inside your shoe (orthotics) to help reduce pressure on your toe joint.  Taking medicine to ease pain, inflammation, and swelling.  Applying heat or ice to the affected area.  Doing stretching exercises.  Surgery to remove scar tissue and move the toes back into their normal position. This treatment is rare. Follow these instructions at home: Managing pain, stiffness, and swelling   If directed, put ice on the painful area: ? Put ice in a plastic bag. ? Place a towel between your skin and the bag. ? Leave the ice on for 20 minutes, 2-3 times a day. Activity   If directed, apply heat to the affected area before you exercise. Use the heat source that your health care provider recommends, such as a moist heat pack or a heating pad. ? Place a towel between your skin and the heat source. ? Leave the heat on for 20-30 minutes. ? Remove the heat if your skin turns bright red. This is especially important if you are unable to feel pain, heat, or cold. You may have a greater risk of getting burned.  Do exercises as told by your health care provider. General instructions  Support your toe joint with proper footwear, shoe padding, or taping as told by your health care provider.  Take over-the-counter and prescription medicines only as told by your health care provider.  Keep all follow-up visits as told by your health care provider. This is important. Contact a health care provider if your symptoms:  Get worse.  Do not improve in 2 weeks. Get help right  away if you have:  Severe pain and trouble with walking. Summary  A bunion is a bump on the base of the big toe that forms when the bones of the big toe joint move out of position.  Bunions can make walking painful.  Treatment depends on the severity of your symptoms.  Support your toe joint with proper footwear, shoe padding, or taping as told by your health care provider. This information is not intended to replace advice given to you by your health care provider. Make sure you discuss any questions you have with your health care provider. Document Revised: 02/27/2018 Document Reviewed: 01/03/2018 Elsevier Patient Education  [place].  Hip Bursitis  Hip bursitis is inflammation of a fluid-filled sac (bursa) in the hip joint. The bursa prevents the bones in the hip joint from rubbing against each other. Hip bursitis can cause mild to moderate pain, and symptoms often come and go over time. What are the causes? This condition may be caused by:  Injury to the hip.  Overuse of the muscles that surround the hip joint.  Previous injury or surgery of the hip.  Arthritis or gout.  Diabetes.  Thyroid disease.  Infection. In some cases, the cause may not be known. What are the signs or symptoms? Symptoms of this condition include:  Mild or moderate pain in the hip area. Pain may get worse with movement.  Tenderness and swelling of the hip, especially on the outer side of the hip.  In rare cases, the bursa may become infected. This may cause a fever, as well as warmth and redness in the area. Symptoms may come and go. How is this diagnosed? This condition may be diagnosed based on:  A physical exam.  Your medical history.  X-rays.  Removal of  fluid from your inflamed bursa for testing (biopsy). You may be sent to a health care provider who specializes in bone diseases (orthopedist) or a provider who specializes in joint inflammation (rheumatologist). How is  this treated? This condition is treated by resting, icing, applying pressure (compression), and raising (elevating) the injured area. This is called RICE treatment. In some cases, this may be enough to make your symptoms go away. Treatment may also include:  Using crutches.  Draining fluid out of the bursa to help relieve swelling.  Injecting medicine that helps to reduce inflammation (cortisone).  Additional medicines if the bursa is infected. Follow these instructions at home: Managing pain, stiffness, and swelling   If directed, put ice on the painful area. ? Put ice in a plastic bag. ? Place a towel between your skin and the bag. ? Leave the ice on for 20 minutes, 2-3 times a day. ? Raise (elevate) your hip above the level of your heart as much as you can without pain. To do this, try putting a pillow under your hips while you lie down. Activity  Return to your normal activities as told by your health care provider. Ask your health care provider what activities are safe for you.  Rest and protect your hip as much as possible until your pain and swelling get better. General instructions  Take over-the-counter and prescription medicines only as told by your health care provider.  Wear compression wraps only as told by your health care provider.  Do not use your hip to support your body weight until your health care provider says that you can. Use crutches as told by your health care provider.  Gently massage and stretch your injured area as often as is comfortable.  Keep all follow-up visits as told by your health care provider. This is important. How is this prevented?  Exercise regularly, as told by your health care provider.  Warm up and stretch before being active.  Cool down and stretch after being active.  If an activity irritates your hip or causes pain, avoid the activity as much as possible.  Avoid sitting down for long periods at a time. Contact a health care  provider if you:  Have a fever.  Develop new symptoms.  Have difficulty walking or doing everyday activities.  Have pain that gets worse or does not get better with medicine.  Develop red skin or a feeling of warmth in your hip area. Get help right away if you:  Cannot move your hip.  Have severe pain. Summary  Hip bursitis is inflammation of a fluid-filled sac (bursa) in the hip joint.  Hip bursitis can cause mild to moderate pain, and symptoms often come and go over time.  This condition is treated with rest, ice, compression, elevation, and medicines. This information is not intended to replace advice given to you by your health care provider. Make sure you discuss any questions you have with your health care provider. Document Revised: 05/01/2018 Document Reviewed: 05/01/2018 Elsevier Patient Education  [employer].     If you have lab work done today you will be contacted with your lab results within the next 2 weeks.  If you have not heard from [place] then please contact us. The fastest way to get your results is to register for My Chart.   IF you received an x-ray today, you will receive an invoice from [facility]. Please contact [facility] at [phone] with questions or concerns regarding your invoice.  IF you received labwork today, you will receive an invoice from [facility]. Please contact [facility] at [phone] with questions or concerns regarding your invoice.   Our billing staff will not be able to assist you with questions regarding bills from these companies.  You will be contacted with the lab results as soon as they are available. The fastest way to get your results is to activate your My Chart account. Instructions are located on the last page of this paperwork. If you have not heard from us regarding the results in 2 weeks, please contact this office.         No follow-ups on file.   Janace Hoardavid Bauer Ausborn, MD 01/18/2020

## 2020-02-08 ENCOUNTER — Ambulatory Visit: Payer: 59 | Admitting: Family Medicine

## 2020-02-18 ENCOUNTER — Ambulatory Visit (INDEPENDENT_AMBULATORY_CARE_PROVIDER_SITE_OTHER): Payer: 59 | Admitting: Family Medicine

## 2020-02-18 ENCOUNTER — Other Ambulatory Visit: Payer: Self-pay

## 2020-02-18 DIAGNOSIS — Z3009 Encounter for other general counseling and advice on contraception: Secondary | ICD-10-CM

## 2020-02-25 ENCOUNTER — Ambulatory Visit (INDEPENDENT_AMBULATORY_CARE_PROVIDER_SITE_OTHER): Payer: 59 | Admitting: Registered Nurse

## 2020-02-25 ENCOUNTER — Other Ambulatory Visit: Payer: Self-pay

## 2020-02-25 ENCOUNTER — Encounter: Payer: Self-pay | Admitting: Family Medicine

## 2020-02-25 VITALS — BP 116/71 | HR 69 | Temp 98.0°F | Resp 18 | Ht 61.0 in | Wt 120.8 lb

## 2020-02-25 DIAGNOSIS — Z3042 Encounter for surveillance of injectable contraceptive: Secondary | ICD-10-CM | POA: Diagnosis not present

## 2020-02-25 DIAGNOSIS — Z789 Other specified health status: Secondary | ICD-10-CM

## 2020-02-25 LAB — POCT URINE PREGNANCY: Preg Test, Ur: NEGATIVE

## 2020-02-25 NOTE — Progress Notes (Signed)
Pt presented for DepoProvera injection. Pt needs a U preg due to off schedule dose. U Preg negative pt received injection without issue.

## 2020-02-25 NOTE — Patient Instructions (Signed)
° ° ° °  If you have lab work done today you will be contacted with your lab results within the next 2 weeks.  If you have not heard from us then please contact us. The fastest way to get your results is to register for My Chart. ° ° °IF you received an x-ray today, you will receive an invoice from [facility]. Please contact [facility] at [phone] with questions or concerns regarding your invoice.  ° °IF you received labwork today, you will receive an invoice from [facility]. Please contact [facility] at [phone] with questions or concerns regarding your invoice.  ° °Our billing staff will not be able to assist you with questions regarding bills from these companies. ° °You will be contacted with the lab results as soon as they are available. The fastest way to get your results is to activate your My Chart account. Instructions are located on the last page of this paperwork. If you have not heard from us regarding the results in 2 weeks, please contact this office. °  ° ° ° °

## 2020-04-08 ENCOUNTER — Telehealth (INDEPENDENT_AMBULATORY_CARE_PROVIDER_SITE_OTHER): Payer: 59 | Admitting: Psychiatry

## 2020-04-08 ENCOUNTER — Encounter (HOSPITAL_COMMUNITY): Payer: Self-pay | Admitting: Psychiatry

## 2020-04-08 ENCOUNTER — Other Ambulatory Visit: Payer: Self-pay

## 2020-04-08 DIAGNOSIS — F419 Anxiety disorder, unspecified: Secondary | ICD-10-CM

## 2020-04-08 DIAGNOSIS — F319 Bipolar disorder, unspecified: Secondary | ICD-10-CM

## 2020-04-08 MED ORDER — BUPROPION HCL ER (XL) 150 MG PO TB24: 150.0000 mg | ORAL_TABLET | Freq: Every day | ORAL | 1 refills | Status: DC

## 2020-04-08 MED ORDER — LAMOTRIGINE 200 MG PO TABS: 200.0000 mg | ORAL_TABLET | Freq: Every day | ORAL | 0 refills | Status: DC

## 2020-04-08 MED FILL — buPROPion HCL ER (XL) 150 M: 150 | 30 days supply | Qty: 30 | Fill #0

## 2020-04-08 NOTE — Progress Notes (Signed)
Virtual Visit via Telephone Note  I connected with Paula Ortiz on 04/08/20 at 10:40 AM EDT by telephone and verified that I am speaking with the correct person using two identifiers.  Location: Patient: work Provider: home office   I discussed the limitations, risks, security and privacy concerns of performing an evaluation and management service by telephone and the availability of in person appointments. I also discussed with the patient that there may be a patient responsible charge related to this service. The patient expressed understanding and agreed to proceed.   History of Present Illness: Patient is evaluated by phone session.  She is taking Lamictal and Zoloft and so far she feels things are going well.  She denies any irritability, mood swings, anger, mania or any psychosis however she gets tired and sometimes she has no energy to do things.  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  She reported gets easily tired but she is sleeping better.  She has no tremors, shakes, rash or any itching.  She denies any crying spells.  Her appetite and weight is unchanged from the past.  She has not done psychological testing to rule out ADHD but is still struggling with some time lack of focus and attention.  She is taking a break from school because of COVID condition.   Past Psychiatric History:Viewed. H/O Bipolarsince age 11. Saw Dr Yetta Barre at [facility]. Lamictal and Zoloftworked. TriedAbilify. Noh/oinpatientorsuicidal attempt.H/Ococaine, ETOHandTHCbut claims to be sober since 2018.    Psychiatric Specialty Exam: Physical Exam  Review of Systems  There were no vitals taken for this visit.There is no height or weight on file to calculate BMI.  General Appearance: NA  Eye Contact:  NA  Speech:  Clear and Coherent  Volume:  Normal  Mood:  Tired, fatigue  Affect:  NA  Thought Process:  Goal  Directed  Orientation:  Full (Time, Place, and Person)  Thought Content:  Rumination  Suicidal Thoughts:  No  Homicidal Thoughts:  No  Memory:  Immediate;   Good Recent;   Good Remote;   Good  Judgement:  Good  Insight:  Present  Psychomotor Activity:  NA  Concentration:  Concentration: Fair and Attention Span: Fair  Recall:  Good  Fund of Knowledge:  Good  Language:  Good  Akathisia:  No  Handed:  Right  AIMS (if indicated):     Assets:  Communication Skills Desire for Improvement Housing Resilience Social Support Talents/Skills Transportation  ADL's:  Intact  Cognition:  WNL  Sleep:   Okay      Assessment and Plan: Bipolar disorder type I.  Anxiety.  Patient is doing well but struggle with fatigue, lack of energy and getting easily tired.  I recommend to try Wellbutrin to help the symptoms as she also struggled with focus and attention some time.  We will cross taper with Zoloft.  She will reduce Zoloft to 50 mg and start Wellbutrin XL 150 mg daily.  She does not want to change the Lamictal dose since it is helping her mood irritability and anger.  Continue Lamictal 200 mg daily.  Follow-up in 4 to 6 weeks and we will consider either discontinuing Zoloft and adjusting the Wellbutrin dose if patient like it.  Recommended to call us back if she has any question or any concern.       Follow Up Instructions:    I discussed the  assessment and treatment plan with the patient. The patient was provided an opportunity to ask questions and all were answered. The patient agreed with the plan and demonstrated an understanding of the instructions.   The patient was advised to call back or seek an in-person evaluation if the symptoms worsen or if the condition fails to improve as anticipated.  I provided 15 minutes of non-face-to-face time during this encounter.   Cleotis Nipper, MD

## 2020-04-16 ENCOUNTER — Telehealth: Payer: 59 | Admitting: Physician Assistant

## 2020-04-16 DIAGNOSIS — R21 Rash and other nonspecific skin eruption: Secondary | ICD-10-CM | POA: Diagnosis not present

## 2020-04-16 MED ORDER — TRIAMCINOLONE ACETONIDE 0.5 % EX OINT: 1.0000 "application " | TOPICAL_OINTMENT | Freq: Two times a day (BID) | CUTANEOUS | 0 refills | Status: AC

## 2020-04-16 NOTE — Progress Notes (Signed)
E-Visit for Eczema  We are sorry that you are not feeling well. Here is how we plan to help! Based on what you shared with me it looks like you have eczema (atopic dermatitis).  Although the cause of eczema is not completely understood, genetics appear to play a strong role, and people with a family history of eczema are at increased risk of developing the condition. In most people with eczema, there is a genetic abnormality in the outermost layer of the skin, called the epidermis   Most people with eczema develop their first symptoms as children, before the age of 18. Intense itching of the skin, patches of redness, small bumps, and skin flaking are common. Scratching can further inflame the skin and worsen the itching. The itchiness may be more noticeable at nighttime.  Eczema commonly affects the back of the neck, the elbow creases, and the backs of the knees. Other affected areas may include the face, wrists, and forearms. The skin may become thickened and darkened, or even scarred, from repeated scratching. Eliminating factors that aggravate your eczema symptoms can help to control the symptoms. Possible triggers may include: ? Cold or dry environments ? Sweating ? Emotional stress or anxiety ? Rapid temperature changes ? Exposure to certain chemicals or cleaning solutions, including soaps and detergents, perfumes and cosmetics, wool or synthetic fibers, dust, sand, and cigarette smoke Keeping your skin hydrated Emollients -- Emollients are creams and ointments that moisturize the skin and prevent it from drying out. The best emollients for people with eczema are thick creams (such as Eucerin, Cetaphil, and Nutraderm) or ointments (such as petroleum jelly, Aquaphor, and Vaseline), which contain little to no water. Emollients are most effective when applied immediately after bathing. Emollients can be applied twice a day or more often if needed. Lotions contain more water than creams and  ointments and are less effective for moisturizing the skin. Bathing -- It is not clear if showers or baths are better for keeping the skin hydrated. Lukewarm baths or showers can hydrate and cool the skin, temporarily relieving itching from eczema. An unscented, mild soap or non-soap cleanser (such as Cetaphil) should be used sparingly. Apply an emollient immediately after bathing or showering to prevent your skin from drying out as a result of water evaporation. Emollient bath additives (products you add to the bath water) have not been found to help relieve symptoms. Hot or long baths (more than 10 to 15 minutes) and showers should be avoided since they can dry out the skin.  Based on what you shared with me you may have eczema.   Triamcinalone ointment (or cream). Apply to the effected areas twice per day.  Do not put this cream on your face, neck or genitals.   Based on what you shared with me you may have a skin infection.    I recommend dilute bleach baths for people with eczema. These baths help to decrease the number of bacteria on the skin that can cause infections or worsen symptoms. To prepare a bleach bath, one-fourth to one-half cup of bleach is placed in a full bathtub (about 40 gallons) of water. Bleach baths are usually taken for 5 to 10 minutes twice per week and should be followed by application of an emollient (listed above). I recommend you take Benadryl 25mg  - 50mg  every 4 hours to control the symptoms (including itching) but if they last over 24 hours it is best that you see an office based provider for follow up.  HOME CARE: . Take lukewarm showers or baths . Apply creams and ointments to prevent the skin from drying (Eucerin, Cetaphil, Nutraderm, petroleum jelly, Aquaphor or Vaseline) - these products contain less water than other lotions and are more effective for moisturizing the skin . Limit exposure to cold or dry environments, sweating, emotional stress and anxiety,  rapid temperature changes and exposure to chemicals and cleaning products, soaps and detergents, perfumes, cosmetics, wool and synthetic fibers, dust, sand and cigarette- factors which can aggravate eczema symptoms.  . Use a hydrocortisone cream once or twice a day . Take an antihistamine like Benadryl for widespread rashes that itch.  The adult dosage of Benadryl is 25-50 mg by mouth 4 times daily. Caution: This type of medication may cause sleepiness.  Do not drink alcohol, drive, or operate dangerous machinery while taking antihistamines.  Do not take these medications if you have prostate enlargement.  Read the package instructions thoroughly on all medications that you take.  GET HELP RIGHT AWAY IF: . Symptoms that don't go away after treatment. . Severe itching that persists. . You develop a fever. . Your skin begins to drain. . You have a sore throat. . You become short of breath.  MAKE SURE YOU    Understand these instructions.  Will watch your condition.  Will get help right away if you are not doing well or get worse.  Thank you for choosing an e-visit. Your e-visit answers were reviewed by a board certified advanced clinical practitioner to complete your personal care plan. Depending upon the condition, your plan could have included both over the counter or prescription medications. Please review your pharmacy choice. Make sure the pharmacy is open so you can pick up prescription now. If there is a problem, you may contact your provider through [employer] and have the prescription routed to another pharmacy. Your safety is important to [place]. If you have drug allergies check your prescription carefully.  For the next 24 hours you can use MyChart to ask questions about today's visit, request a non-urgent call back, or ask for a work or school excuse. You will get an email in the next two days asking about your experience. I hope that your e-visit has been valuable and will  speed your recovery        Greater than 5 minutes, yet less than 10 minutes of time have been spent researching, coordinating and implementing care for this patient today.

## 2020-04-17 MED FILL — TRIAMCINOLONE 0.5% OINTMENT: 0.5 | 15 days supply | Qty: 30 | Fill #0

## 2020-05-19 ENCOUNTER — Other Ambulatory Visit: Payer: Self-pay

## 2020-05-19 ENCOUNTER — Ambulatory Visit (INDEPENDENT_AMBULATORY_CARE_PROVIDER_SITE_OTHER): Payer: 59 | Admitting: Family Medicine

## 2020-05-19 DIAGNOSIS — Z3009 Encounter for other general counseling and advice on contraception: Secondary | ICD-10-CM

## 2020-05-19 DIAGNOSIS — Z3042 Encounter for surveillance of injectable contraceptive: Secondary | ICD-10-CM | POA: Diagnosis not present

## 2020-05-21 MED FILL — buPROPion HCL ER (XL) 150 M: 150 | 30 days supply | Qty: 30 | Fill #1

## 2020-05-26 ENCOUNTER — Other Ambulatory Visit: Payer: Self-pay

## 2020-05-26 ENCOUNTER — Telehealth (INDEPENDENT_AMBULATORY_CARE_PROVIDER_SITE_OTHER): Payer: 59 | Admitting: Psychiatry

## 2020-05-26 ENCOUNTER — Encounter (HOSPITAL_COMMUNITY): Payer: Self-pay | Admitting: Psychiatry

## 2020-05-26 VITALS — Wt 120.0 lb

## 2020-05-26 DIAGNOSIS — F419 Anxiety disorder, unspecified: Secondary | ICD-10-CM | POA: Diagnosis not present

## 2020-05-26 DIAGNOSIS — F319 Bipolar disorder, unspecified: Secondary | ICD-10-CM

## 2020-05-26 MED ORDER — BUPROPION HCL ER (XL) 300 MG PO TB24: 300.0000 mg | ORAL_TABLET | Freq: Every day | ORAL | 0 refills | Status: DC

## 2020-05-26 MED ORDER — HYDROXYZINE HCL 10 MG PO TABS: 10.0000 mg | ORAL_TABLET | Freq: Every day | ORAL | 0 refills | Status: DC | PRN

## 2020-05-26 MED ORDER — LAMOTRIGINE 200 MG PO TABS: 200.0000 mg | ORAL_TABLET | Freq: Every day | ORAL | 0 refills | Status: DC

## 2020-05-26 MED FILL — buPROPion HCL ER (XL) 300 M: 300 | 90 days supply | Qty: 90 | Fill #0

## 2020-05-26 MED FILL — hydrOXYzine HCL 10 MG TABS: 10 | 30 days supply | Qty: 30 | Fill #0

## 2020-05-26 MED FILL — SUBVENITE 200 MG TABS: 200 | 90 days supply | Qty: 90 | Fill #0

## 2020-05-26 NOTE — Progress Notes (Signed)
Virtual Visit via Telephone Note  I connected with Paula Ortiz on 05/26/20 at 10:40 AM EDT by telephone and verified that I am speaking with the correct person using two identifiers.  Location: Patient: home Provider: home office   I discussed the limitations, risks, security and privacy concerns of performing an evaluation and management service by telephone and the availability of in person appointments. I also discussed with the patient that there may be a patient responsible charge related to this service. The patient expressed understanding and agreed to proceed.   History of Present Illness: Patient is evaluated by phone session.  On the last visit we started her on Wellbutrin and recommended to cut down and stop the Zoloft gradually.  She noticed improvement in her mood, energy, motivation.  She has more energy and she feels it is helping her mood.  She still feels sometimes anxious and like to restart hydroxyzine 10 mg which she used to take in the past for anxiety and sleep.  She like to try higher dose of Wellbutrin since she noticed improvement with the medication.  She is no longer taking Zoloft.  Her mood swings, anger, manic symptoms are stable with Lamictal.  She has no rash, itching tremors or shakes.  She is hoping to move next March to have her home house.  Her lease is up next March for her current place.  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  She is also taking a break from school because of COVID condition.  Her appetite is okay.  Past Psychiatric History:Viewed. H/O Bipolarsince age 40. Saw Dr Yetta Barre at [facility]. Lamictal and Zoloftworked. TriedAbilify. Noh/oinpatientorsuicidal attempt.H/Ococaine, ETOHandTHCbut claims to be sober since 2018.    Psychiatric Specialty Exam: Physical Exam  Review of Systems  Weight 120 lb (54.4 kg).There is no height or weight on file to calculate BMI.  General Appearance:  NA  Eye Contact:  NA  Speech:  Normal Rate  Volume:  Normal  Mood:  Anxious  Affect:  NA  Thought Process:  Coherent  Orientation:  Full (Time, Place, and Person)  Thought Content:  WDL  Suicidal Thoughts:  No  Homicidal Thoughts:  No  Memory:  Immediate;   Good Recent;   Good Remote;   Good  Judgement:  Good  Insight:  Good  Psychomotor Activity:  NA  Concentration:  Concentration: Good and Attention Span: Good  Recall:  Good  Fund of Knowledge:  Good  Language:  Good  Akathisia:  No  Handed:  Right  AIMS (if indicated):     Assets:  Communication Skills Desire for Improvement Housing Resilience Social Support Transportation  ADL's:  Intact  Cognition:  WNL  Sleep:   ok      Assessment and Plan: Bipolar disorder type I.  Anxiety.  Patient doing better since we started Wellbutrin.  She like to increase the dose of Wellbutrin.  She also wanted low-dose hydroxyzine to help her anxiety and occasional use for insomnia.  She had used in the past with good response.  She also have eczema and she noticed it helps the eczema.  Discussed medication side effects and benefits.  Continue Lamictal 200 mg daily, we will try Wellbutrin XL 300 mg daily and provide hydroxyzine tablet 10 mg to take as needed.  Discussed medication side effects and benefits.  Recommended to call us back if she has any questions or any concern.  Follow-up in 3 months.  Follow Up Instructions:    I discussed the assessment and treatment plan with the patient. The patient was provided an opportunity to ask questions and all were answered. The patient agreed with the plan and demonstrated an understanding of the instructions.   The patient was advised to call back or seek an in-person evaluation if the symptoms worsen or if the condition fails to improve as anticipated.  I provided 15 minutes of non-face-to-face time during this encounter.   Cleotis Nipper, MD

## 2020-07-03 ENCOUNTER — Encounter: Payer: Self-pay | Admitting: Registered Nurse

## 2020-07-03 NOTE — Progress Notes (Signed)
Established Patient Office Visit  Subjective:  Patient ID: Paula Ortiz, adult    DOB: 1995/12/19  Age: 24 y.o. MRN: [id number]  CC:  Chief Complaint  Patient presents with   Contraception    patient is here for birth control but requested a pregnancy test because she missed her window.    HPI [name] presents for urine preg test  Missed dmpa injection window - wants pregnancy test to ensure she is not pregnant Has irregular menses due to dmpa injection No other symptoms or concerns today  Past Medical History:  Diagnosis Date   Anxiety    Bipolar disorder (HCC) 10/19/2018    No past surgical history on file.  Family History  Problem Relation Age of Onset   Cancer Maternal Grandmother     Social History   Socioeconomic History   Marital status: Single    Spouse name: Not on file   Number of children: 0   Years of education: Not on file   Highest education level: Bachelor's degree (e.g., BA, AB, BS)  Occupational History   Not on file  Tobacco Use   Smoking status: Former Smoker    Packs/day: 1.00    Quit date: 08/17/2019    Years since quitting: 0.8   Smokeless tobacco: Never Used  Vaping Use   Vaping Use: Every day  Substance and Sexual Activity   Alcohol use: Yes    Comment: very rare   Drug use: Yes    Types: Marijuana    Comment: not really   Sexual activity: Yes    Birth control/protection: Injection  Other Topics Concern   Not on file  Social History Narrative   Not on file   Social Determinants of Health   Financial Resource Strain:    Difficulty of Paying Living Expenses: Not on file  Food Insecurity:    Worried About Programme researcher, broadcasting/film/video in the Last Year: Not on file   [employer] of Food in the Last Year: Not on file  Transportation Needs:    Lack of Transportation (Medical): Not on file   Lack of Transportation (Non-Medical): Not on file  Physical Activity:    Days of Exercise per Week: Not on file   Minutes  of Exercise per Session: Not on file  Stress:    Feeling of Stress : Not on file  Social Connections:    Frequency of Communication with Friends and Family: Not on file   Frequency of Social Gatherings with Friends and Family: Not on file   Attends Religious Services: Not on file   Active Member of Clubs or Organizations: Not on file   Attends Banker Meetings: Not on file   Marital Status: Not on file  Intimate Partner Violence:    Fear of Current or Ex-Partner: Not on file   Emotionally Abused: Not on file   Physically Abused: Not on file   Sexually Abused: Not on file    Outpatient Medications Prior to Visit  Medication Sig Dispense Refill   sertraline (ZOLOFT) 100 MG tablet Take 1 tablet (100 mg total) by mouth daily. (Patient not taking: Reported on 05/26/2020) 90 tablet 0   lamoTRIgine (LAMICTAL) 200 MG tablet Take 1 tablet (200 mg total) by mouth daily. 90 tablet 0   Facility-Administered Medications Prior to Visit  Medication Dose Route Frequency Provider Last Rate Last Admin   medroxyPROGESTERone Acetate SUSY 150 mg  150 mg Intramuscular Q90 days Myles Lipps, MD  150 mg at 05/19/20 1007    Allergies  Allergen Reactions   Ibuprofen Hives    ROS Review of Systems Negative on 13 pt ros   Objective:    Physical Exam Constitutional:      General: Paula Ortiz is not in acute distress.    Appearance: Normal appearance. Paula Ortiz is normal weight. Paula Ortiz is not ill-appearing, toxic-appearing or diaphoretic.  Cardiovascular:     Rate and Rhythm: Normal rate and regular rhythm.     Heart sounds: Normal heart sounds. No murmur heard.  No friction rub. No gallop.   Pulmonary:     Effort: Pulmonary effort is normal. No respiratory distress.     Breath sounds: Normal breath sounds. No stridor. No wheezing, rhonchi or rales.  Chest:     Chest wall: No tenderness.  Neurological:     General: No focal deficit present.     Mental  Status: Paula Ortiz is alert and oriented to person, place, and time. Mental status is at baseline.  Psychiatric:        Mood and Affect: Mood normal.        Behavior: Behavior normal.        Thought Content: Thought content normal.        Judgment: Judgment normal.     BP 116/71    Pulse 69    Temp 98 F (36.7 C) (Temporal)    Resp 18    Ht 5\' 1"  (1.549 m)    Wt 120 lb 12.8 oz (54.8 kg)    SpO2 94%    BMI 22.82 kg/m  Wt Readings from Last 3 Encounters:  02/25/20 120 lb 12.8 oz (54.8 kg)  01/18/20 128 lb 8 oz (58.3 kg)  05/01/19 128 lb (58.1 kg)     Health Maintenance Due  Topic Date Due   COVID-19 Vaccine (1) Never done   PAP-Cervical Cytology Screening  01/27/2020   PAP SMEAR-Modifier  01/27/2020   INFLUENZA VACCINE  04/06/2020    There are no preventive care reminders to display for this patient.  No results found for: TSH Lab Results  Component Value Date   WBC 10.7 (H) 10/20/2018   HGB 10.5 (L) 10/20/2018   HCT 34.4 (L) 10/20/2018   MCV 71.1 (L) 10/20/2018   PLT 221 10/20/2018   Lab Results  Component Value Date   NA 141 10/20/2018   K 3.4 (L) 10/20/2018   CO2 23 10/20/2018   GLUCOSE 104 (H) 10/20/2018   BUN 6 10/20/2018   CREATININE 0.64 10/20/2018   BILITOT 1.1 10/20/2018   ALKPHOS 94 10/20/2018   AST 225 (H) 10/20/2018   ALT 338 (H) 10/20/2018   PROT 6.5 10/20/2018   ALBUMIN 3.9 10/20/2018   CALCIUM 8.9 10/20/2018   ANIONGAP 6 10/20/2018   No results found for: CHOL No results found for: HDL No results found for: LDLCALC No results found for: TRIG No results found for: CHOLHDL No results found for: 10/22/2018    Assessment & Plan:   Problem List Items Addressed This Visit    None    Visit Diagnoses    Uses Depo-Provera as primary birth control method    -  Primary   Relevant Orders   POCT urine pregnancy (Completed)      No orders of the defined types were placed in this encounter.   Follow-up: No follow-ups on file.    PLAN  Urine preg test negative  Ok to continue on dmpa  Return  in window  Patient encouraged to call clinic with any questions, comments, or concerns.  Janeece Agee, NP

## 2020-08-11 ENCOUNTER — Other Ambulatory Visit: Payer: Self-pay

## 2020-08-11 ENCOUNTER — Ambulatory Visit (INDEPENDENT_AMBULATORY_CARE_PROVIDER_SITE_OTHER): Payer: 59 | Admitting: Registered Nurse

## 2020-08-11 DIAGNOSIS — Z3042 Encounter for surveillance of injectable contraceptive: Secondary | ICD-10-CM

## 2020-08-11 NOTE — Progress Notes (Addendum)
Pt tolerated well. Depo given in the RUOQ.  Med was from clinic stock.   medroxyPROGESTERone Acetate SUSY 150 mgDose: 150 mg : Intramuscular : Every 3 months  Ordered Admin Dose: 1 mL = 150 mg of 150 mg/mL  Last Admin: 09/13/21at1007 (Given)    Next shot due 10/27/20-11/10/20 pt stated that she will make an appointment on the way out.

## 2020-08-21 ENCOUNTER — Other Ambulatory Visit: Payer: Self-pay

## 2020-08-21 ENCOUNTER — Encounter (HOSPITAL_COMMUNITY): Payer: Self-pay | Admitting: Psychiatry

## 2020-08-21 ENCOUNTER — Telehealth (INDEPENDENT_AMBULATORY_CARE_PROVIDER_SITE_OTHER): Payer: 59 | Admitting: Psychiatry

## 2020-08-21 ENCOUNTER — Other Ambulatory Visit (HOSPITAL_COMMUNITY): Payer: Self-pay | Admitting: Psychiatry

## 2020-08-21 DIAGNOSIS — F419 Anxiety disorder, unspecified: Secondary | ICD-10-CM

## 2020-08-21 DIAGNOSIS — F319 Bipolar disorder, unspecified: Secondary | ICD-10-CM

## 2020-08-21 MED ORDER — BUPROPION HCL ER (XL) 150 MG PO TB24: 150.0000 mg | ORAL_TABLET | Freq: Every day | ORAL | 0 refills | Status: DC

## 2020-08-21 MED ORDER — LAMOTRIGINE 200 MG PO TABS: 200.0000 mg | ORAL_TABLET | Freq: Every day | ORAL | 0 refills | Status: DC

## 2020-08-21 MED FILL — buPROPion HCL ER (XL) 150 M: 150 | 90 days supply | Qty: 90 | Fill #0

## 2020-08-21 MED FILL — SUBVENITE 200 MG TABS: 200 | 90 days supply | Qty: 90 | Fill #0

## 2020-08-21 NOTE — Progress Notes (Signed)
Virtual Visit via Telephone Note  I connected with Paula Ortiz on 08/21/20 at 11:40 AM EST by telephone and verified that I am speaking with the correct person using two identifiers.  Location: Patient: Out side in [employer] Provider: Home Office   I discussed the limitations, risks, security and privacy concerns of performing an evaluation and management service by telephone and the availability of in person appointments. I also discussed with the patient that there may be a patient responsible charge related to this service. The patient expressed understanding and agreed to proceed.   History of Present Illness: Patient is evaluated by phone session. She is on the phone by herself. She told that she had to cut down the Wellbutrin back to her original dose of 150 because higher dose was causing nausea and she started to lose weight. Since she cut down the dose she is feeling better. She noticed her energy level remains good. She is more social, active and denies any mania, anger, irritability. She is sleeping okay and occasionally needed hydroxyzine. She like Lamictal which is keeping her mood stable. She noticed more social and active with the family member and she has more happy days. Her work is going well. She is hoping to move into her house and March. Patient denies drinking or using any illegal substances. She has no rash, itching, tremors or shakes. She like to keep the Wellbutrin XL 150 mg daily and Lamictal 200 mg daily.   Past Psychiatric History:Viewed. H/O Bipolarsince age 89. Saw Dr Yetta Barre at [facility]. Lamictal and Zoloftworked. TriedAbilify. Noh/oinpatientorsuicidal attempt.H/Ococaine, ETOHandTHCbut claims to be sober since 2018.   Psychiatric Specialty Exam: Physical Exam  Review of Systems  Weight 115 lb (52.2 kg).There is no height or weight on file to calculate BMI.  General Appearance: NA  Eye Contact:  NA  Speech:  Normal Rate  Volume:  Normal   Mood:  Euthymic  Affect:  NA  Thought Process:  Goal Directed  Orientation:  Full (Time, Place, and Person)  Thought Content:  Logical  Suicidal Thoughts:  No  Homicidal Thoughts:  No  Memory:  Immediate;   Good Recent;   Good Remote;   Good  Judgement:  Good  Insight:  Good  Psychomotor Activity:  NA  Concentration:  Concentration: Good and Attention Span: Good  Recall:  Good  Fund of Knowledge:  Good  Language:  Good  Akathisia:  No  Handed:  Right  AIMS (if indicated):     Assets:  Communication Skills Desire for Improvement Housing Resilience Social Support Talents/Skills  ADL's:  Intact  Cognition:  WNL  Sleep:   ok      Assessment and Plan: Bipolar disorder type I. Anxiety  Patient reducing the dose of Wellbutrin because she felt higher dose causing nausea. She is tolerating Wellbutrin XL 150 without any side effects. I recommend if she noticed her anxiety coming back then she should call us. For now she is comfortable with the current dose. Continue Wellbutrin XL 150 mg daily, Lamictal 200 mg daily and hydroxyzine as needed. She does not need a new prescription of hydroxyzine since she has leftover. Recommended to call us back if she has any question or any concern. Follow-up in 3 months.  Follow Up Instructions:    I discussed the assessment and treatment plan with the patient. The patient was provided an opportunity to ask questions and all were answered. The patient agreed with the plan and demonstrated an understanding of the instructions.  The patient was advised to call back or seek an in-person evaluation if the symptoms worsen or if the condition fails to improve as anticipated.  I provided 14 minutes of non-face-to-face time during this encounter.   Cleotis Nipper, MD

## 2020-08-26 ENCOUNTER — Ambulatory Visit (HOSPITAL_COMMUNITY): Payer: 59 | Admitting: Psychiatry

## 2020-10-27 ENCOUNTER — Other Ambulatory Visit (HOSPITAL_COMMUNITY): Payer: Self-pay | Admitting: Psychiatry

## 2020-10-27 DIAGNOSIS — F419 Anxiety disorder, unspecified: Secondary | ICD-10-CM

## 2020-10-27 DIAGNOSIS — F411 Generalized anxiety disorder: Secondary | ICD-10-CM

## 2020-10-30 ENCOUNTER — Other Ambulatory Visit (HOSPITAL_COMMUNITY): Payer: Self-pay | Admitting: Psychiatry

## 2020-10-30 DIAGNOSIS — F411 Generalized anxiety disorder: Secondary | ICD-10-CM

## 2020-11-03 ENCOUNTER — Ambulatory Visit (INDEPENDENT_AMBULATORY_CARE_PROVIDER_SITE_OTHER): Payer: 59 | Admitting: Family Medicine

## 2020-11-03 ENCOUNTER — Other Ambulatory Visit: Payer: Self-pay

## 2020-11-03 DIAGNOSIS — Z3042 Encounter for surveillance of injectable contraceptive: Secondary | ICD-10-CM | POA: Diagnosis not present

## 2020-11-03 NOTE — Progress Notes (Signed)
LUOQ Pt tolerated well [id number] 05/26/22

## 2020-11-18 ENCOUNTER — Other Ambulatory Visit (HOSPITAL_COMMUNITY): Payer: Self-pay | Admitting: Psychiatry

## 2020-11-18 ENCOUNTER — Encounter (HOSPITAL_COMMUNITY): Payer: Self-pay | Admitting: Psychiatry

## 2020-11-18 ENCOUNTER — Other Ambulatory Visit: Payer: Self-pay

## 2020-11-18 ENCOUNTER — Telehealth (INDEPENDENT_AMBULATORY_CARE_PROVIDER_SITE_OTHER): Payer: 59 | Admitting: Psychiatry

## 2020-11-18 VITALS — Wt 115.0 lb

## 2020-11-18 DIAGNOSIS — F419 Anxiety disorder, unspecified: Secondary | ICD-10-CM | POA: Diagnosis not present

## 2020-11-18 DIAGNOSIS — F319 Bipolar disorder, unspecified: Secondary | ICD-10-CM

## 2020-11-18 MED ORDER — GABAPENTIN 100 MG PO CAPS
100.0000 mg | ORAL_CAPSULE | Freq: Two times a day (BID) | ORAL | 1 refills | Status: DC | PRN
Start: 2020-11-18 — End: 2020-11-18

## 2020-11-18 MED ORDER — BUPROPION HCL ER (XL) 150 MG PO TB24: 150.0000 mg | ORAL_TABLET | Freq: Every day | ORAL | 0 refills | Status: DC

## 2020-11-18 MED ORDER — LAMOTRIGINE 200 MG PO TABS: 200.0000 mg | ORAL_TABLET | Freq: Every day | ORAL | 0 refills | Status: DC

## 2020-11-18 NOTE — Progress Notes (Signed)
Virtual Visit via Telephone Note  I connected with Paula Ortiz on 11/18/20 at 10:40 AM EDT by telephone and verified that I am speaking with the correct person using two identifiers.  Location: Patient: Home Provider: Home Office   I discussed the limitations, risks, security and privacy concerns of performing an evaluation and management service by telephone and the availability of in person appointments. I also discussed with the patient that there may be a patient responsible charge related to this service. The patient expressed understanding and agreed to proceed.   History of Present Illness: Patient is evaluated by phone session.  She is taking Wellbutrin but does not take the hydroxyzine as it makes her sleepy.  She feels anxious and nervous because of her living situation and her job.  She is working in [employer] and she feels she is not well treated.  She already gave them 2-week notice and now looking for a new job.  She was in this job for more than a year.  She also disappointed because not able to get the new house and now she has to find the place to live.  She lives with her partner and relationship going well.  She endorsed sometimes having racing thoughts and there are nights when she does not sleep or sleep during the day.  She is not drinking or using any illegal substances.  She denies any mania, psychosis, anger, highs and lows.  Her energy level is okay.  Her appetite is okay.  She like to try a different medication to help with anxiety.  Past Psychiatric History:Viewed. H/O Bipolarsince age 25. Saw Dr Yetta Barre at [facility]. Lamictal and Zoloftworked. TriedAbilify. Noh/oinpatientorsuicidal attempt.H/Ococaine, ETOHandTHCbut claims to be sober since 2018.   Psychiatric Specialty Exam: Physical Exam  Review of Systems  Weight 115 lb (52.2 kg).There is no height or weight on file to calculate BMI.  General Appearance: NA  Eye Contact:  NA   Speech:  Clear and Coherent  Volume:  Normal  Mood:  Anxious  Affect:  NA  Thought Process:  Goal Directed  Orientation:  Full (Time, Place, and Person)  Thought Content:  Logical  Suicidal Thoughts:  No  Homicidal Thoughts:  No  Memory:  Immediate;   Good Recent;   Good Remote;   Good  Judgement:  Fair  Insight:  Present  Psychomotor Activity:  NA  Concentration:  Concentration: Good and Attention Span: Good  Recall:  Good  Fund of Knowledge:  Good  Language:  Good  Akathisia:  No  Handed:  Right  AIMS (if indicated):     Assets:  Communication Skills Desire for Improvement Housing Physical Health Transportation  ADL's:  Intact  Cognition:  WNL  Sleep:   ok      Assessment and Plan: Bipolar disorder type I.  Anxiety.  Discussed her situational anxiety.  Patient is looking for a better place to live and a better job.  She had a good relationship with her partner.  Recommend to discontinue hydroxyzine as patient is not taking.  Recommend to try gabapentin 100 mg to take 1 to 2 capsules a day to help with anxiety.  Continue Wellbutrin XL 150 mg daily and Lamictal 200 mg daily.  Recommended to call us back if she has any question or any concern.  Follow-up in 3 months.  Follow Up Instructions:    I discussed the assessment and treatment plan with the patient. The patient was provided an opportunity to  ask questions and all were answered. The patient agreed with the plan and demonstrated an understanding of the instructions.   The patient was advised to call back or seek an in-person evaluation if the symptoms worsen or if the condition fails to improve as anticipated.  I provided 16 minutes of non-face-to-face time during this encounter.   Cleotis Nipper, MD

## 2021-02-02 IMAGING — US US ABDOMEN COMPLETE
1 series · 13 of 25 positions shown · non-contrast
Comparison: None.

CLINICAL DATA: Acute generalized abdominal pain.

EXAM:
ABDOMEN ULTRASOUND COMPLETE

[Series 1: us abdomen complete · 13 of 87 slices shown]
[im 1/87]
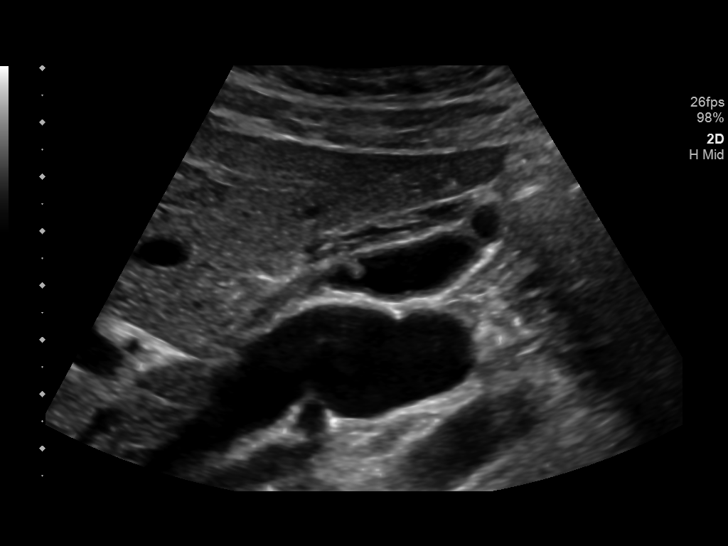
[im 8/87]
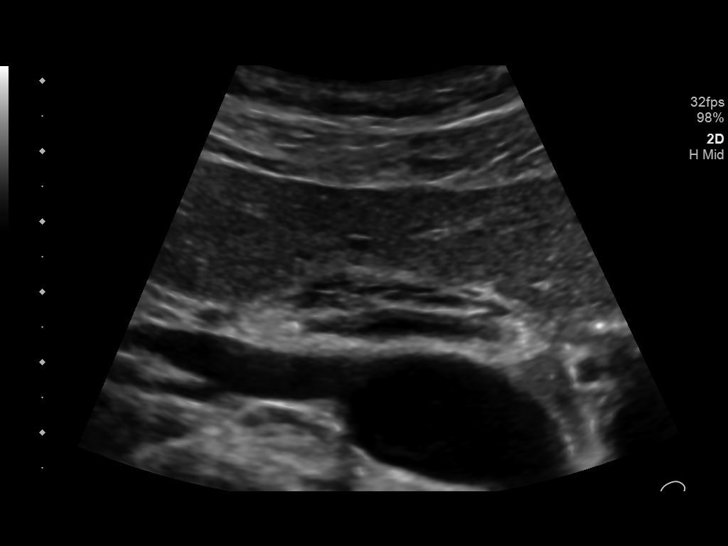
[im 15/87]
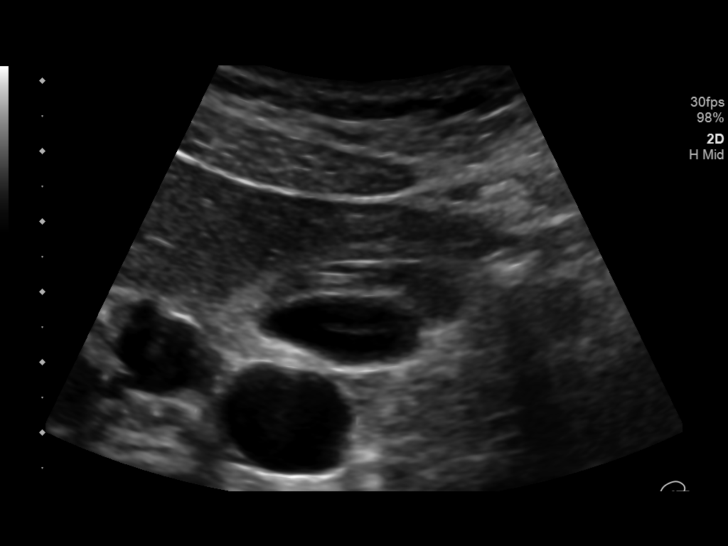
[im 22/87]
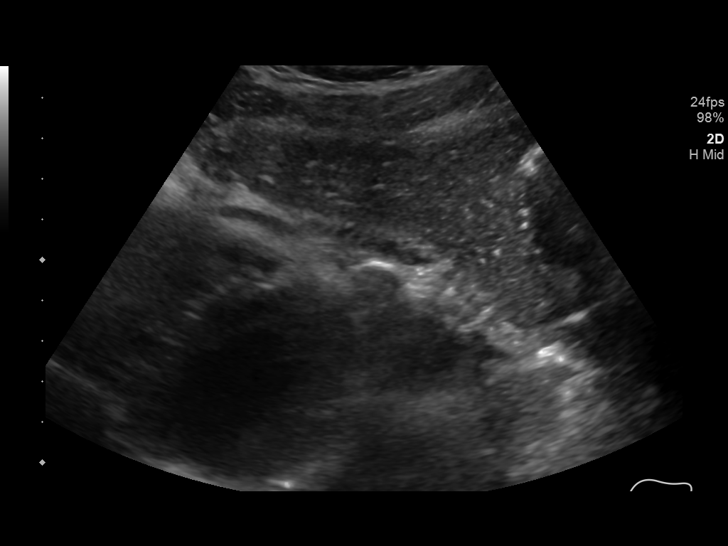
[im 29/87]
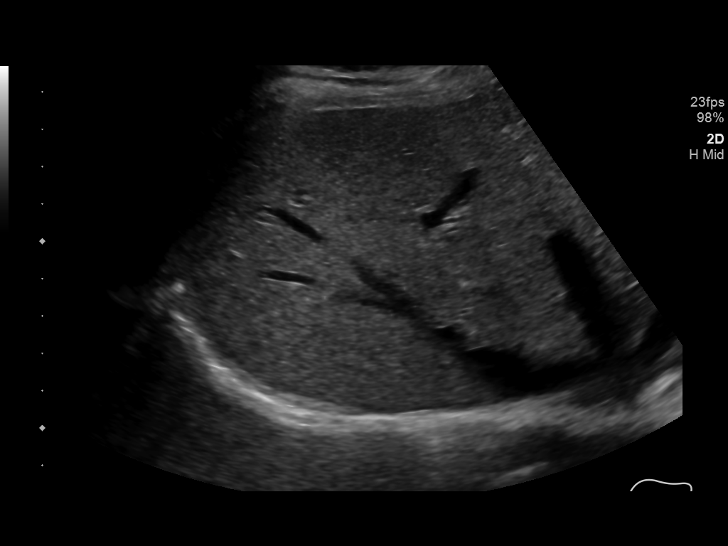
[im 36/87]
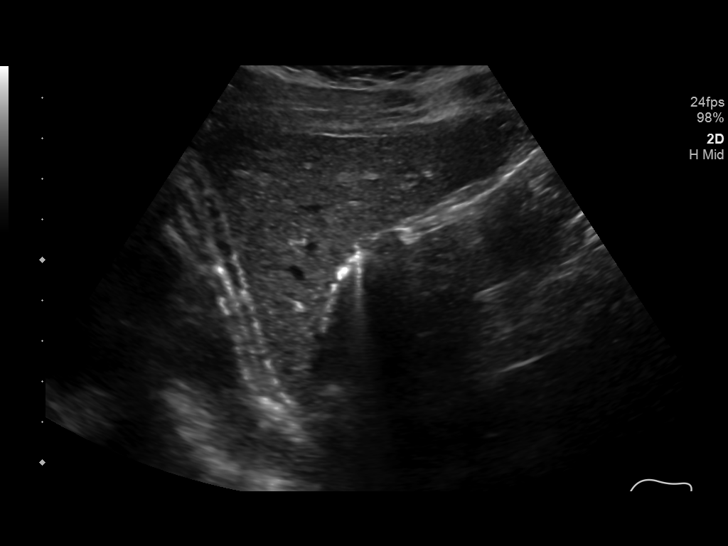
[im 44/87]
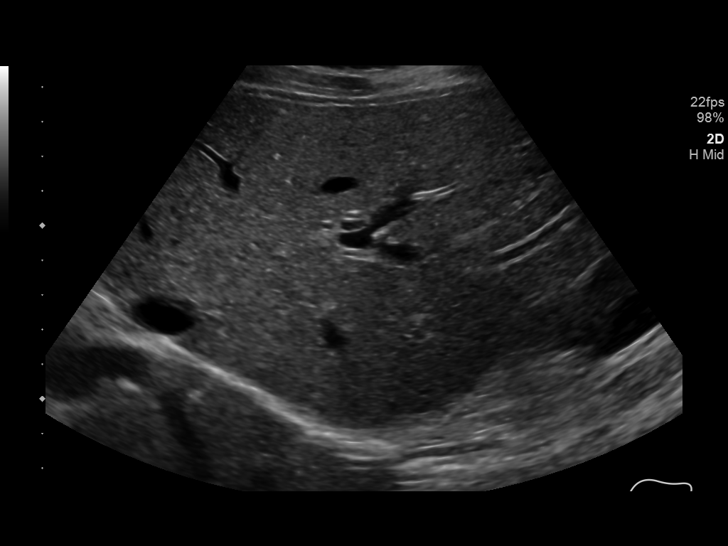
[im 51/87]
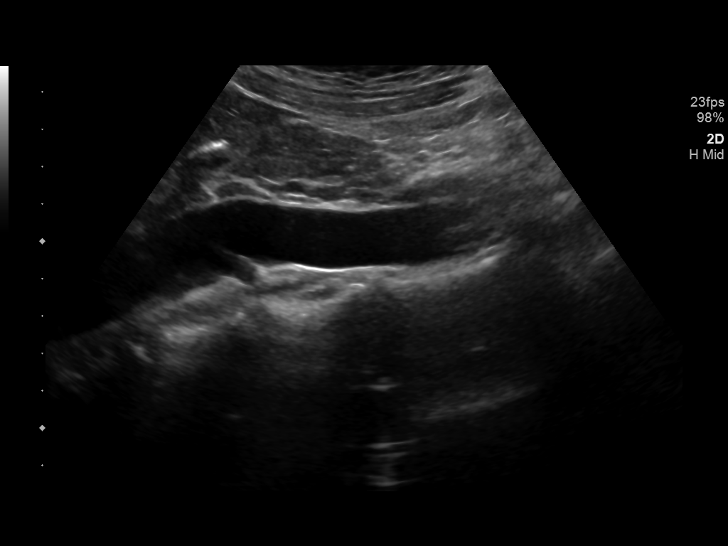
[im 58/87]
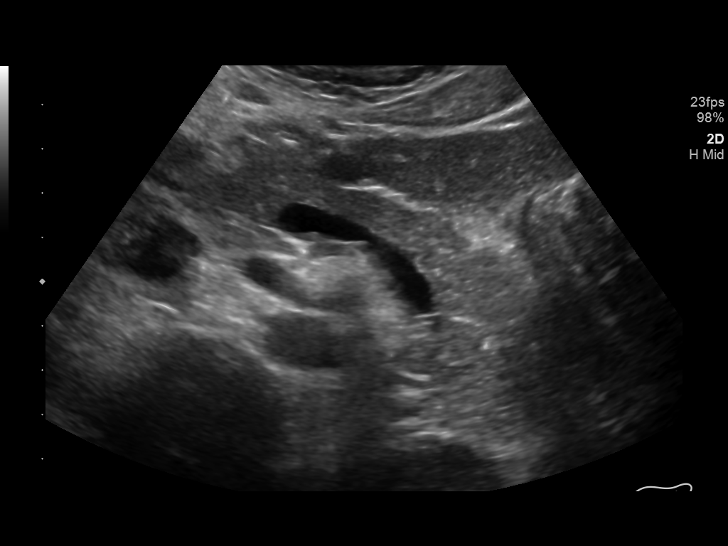
[im 65/87]
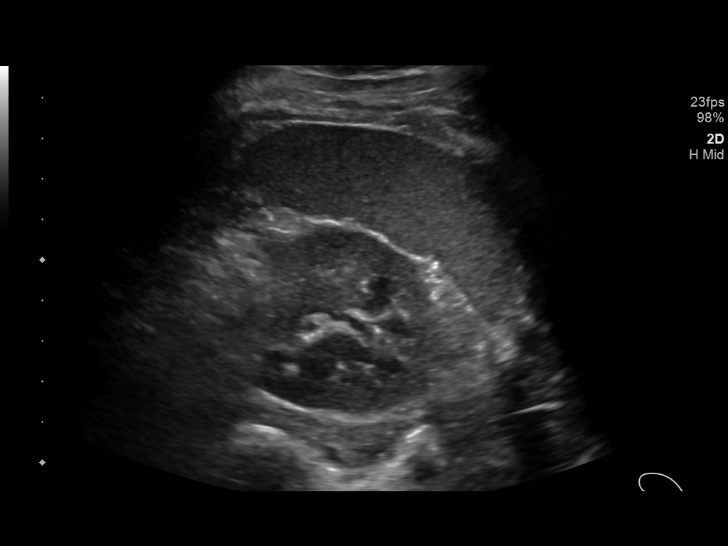
[im 72/87]
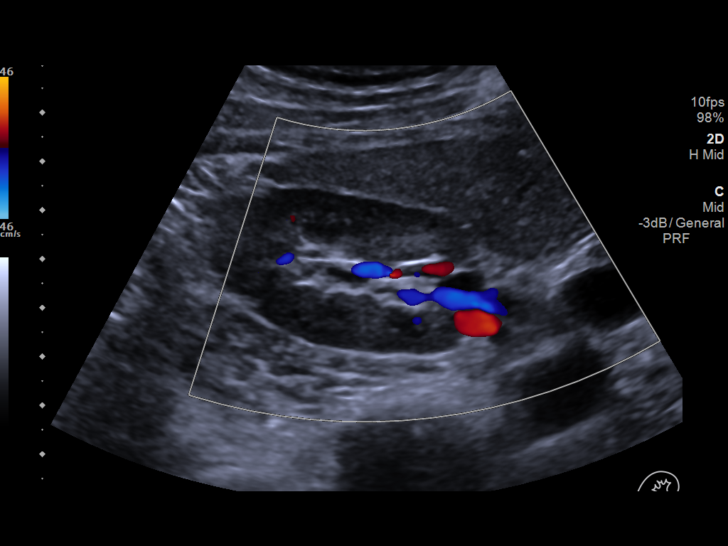
[im 79/87]
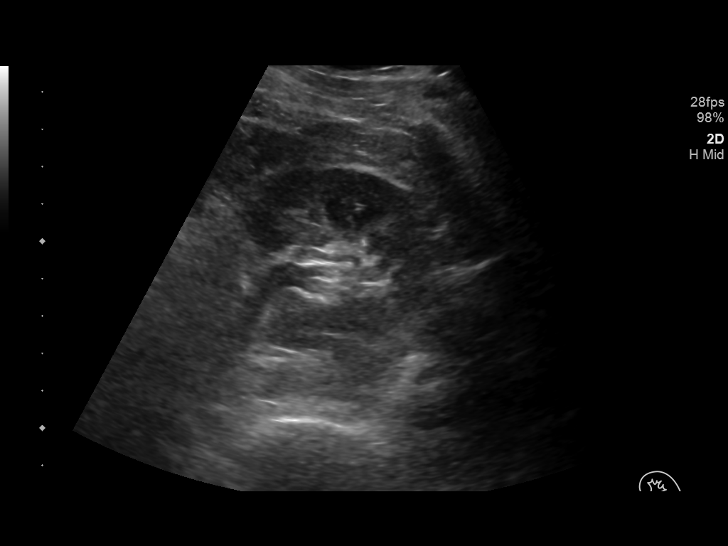
[im 87/87]
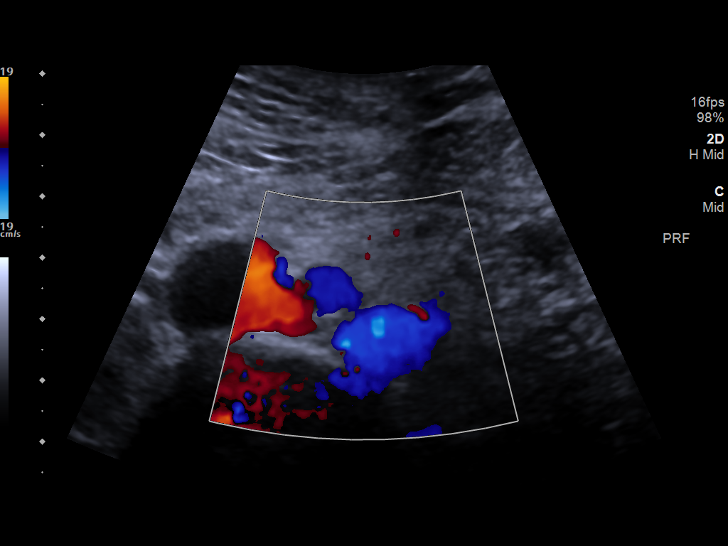

[13 of 25 positions shown; findings below may reference images not displayed]

FINDINGS: Gallbladder: No cholelithiasis is noted. No sonographic Murphy's
sign is noted. However, there is noted severe wall thickening along
1 side of the gallbladder, measuring 7 mm, with possible
pericholecystic fluid.

Common bile duct: Diameter: 3 mm which is within normal limits.

Liver: No focal lesion identified. Within normal limits in
parenchymal echogenicity. Portal vein is patent on color Doppler
imaging with normal direction of blood flow towards the liver.

IVC: No abnormality visualized.

Pancreas: Visualized portion unremarkable.

Spleen: Size and appearance within normal limits.

Right Kidney: Length: 10.5 cm. Echogenicity within normal limits. No
mass or hydronephrosis visualized.

Left Kidney: Length: 11.4 cm. Echogenicity within normal limits. No
mass or hydronephrosis visualized.

Abdominal aorta: No aneurysm visualized.

Other findings: None.
IMPRESSION: Severe focal wall thickening is seen involving the gallbladder with
possible pericholecystic fluid. No cholelithiasis or sonographic
Murphy's sign is noted. These findings are concerning for possible
acute cholecystitis; HIDA scan may be performed for further
evaluation.

No other abnormality seen in the abdomen.

## 2021-02-02 IMAGING — NM NM HEPATOBILIARY IMAGE, INC GB
2 series · 12 of 12 positions shown · non-contrast
Comparison: Ultrasound 10/19/2018

CLINICAL DATA: Nausea, vomiting, elevated LFTs

EXAM:
NUCLEAR MEDICINE HEPATOBILIARY IMAGING
TECHNIQUE: Sequential images of the abdomen were obtained [DATE] minutes
following intravenous administration of radiopharmaceutical.
RADIOPHARMACEUTICALS:  7.7 mCi 4c-HHm  Choletec IV

[Series 1: biliary · 3.25mm/px · 6 of 60 frames shown (1 of 2)]
[frame 6/60]
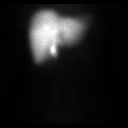
[frame 16/60]
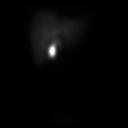
[frame 26/60]
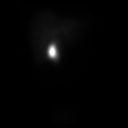
[frame 36/60]
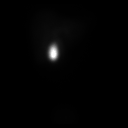
[frame 46/60]
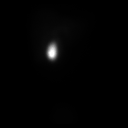
[frame 56/60]
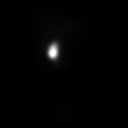

[Series 2: biliary · 3.25mm/px · 6 of 14 frames shown (2 of 2)]
[frame 2/14]
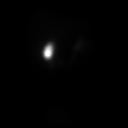
[frame 4/14]
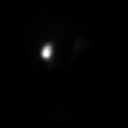
[frame 6/14]
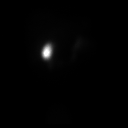
[frame 9/14]
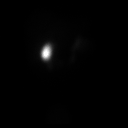
[frame 11/14]
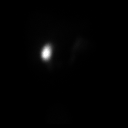
[frame 13/14]
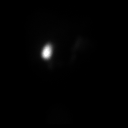

[12 of 12 positions shown; findings below may reference images not displayed]

FINDINGS: Prompt uptake and biliary excretion of activity by the liver is
seen. Gallbladder activity is visualized, consistent with patency of
cystic duct. Biliary activity passes into small bowel, consistent
with patent common bile duct.
IMPRESSION: No evidence of cystic duct or common bile duct obstruction.

## 2021-02-13 ENCOUNTER — Other Ambulatory Visit (HOSPITAL_COMMUNITY): Payer: Self-pay

## 2021-02-13 DIAGNOSIS — R87610 Atypical squamous cells of undetermined significance on cytologic smear of cervix (ASC-US): Secondary | ICD-10-CM | POA: Diagnosis not present

## 2021-02-13 DIAGNOSIS — R87810 Cervical high risk human papillomavirus (HPV) DNA test positive: Secondary | ICD-10-CM | POA: Diagnosis not present

## 2021-02-13 DIAGNOSIS — Z3042 Encounter for surveillance of injectable contraceptive: Secondary | ICD-10-CM | POA: Diagnosis not present

## 2021-02-13 MED ORDER — MEDROXYPROGESTERONE ACETATE 150 MG/ML IM SUSY
PREFILLED_SYRINGE | INTRAMUSCULAR | 1 refills | Status: AC
  Filled 2021-02-13: qty 1, 90d supply, fill #0

## 2021-02-16 ENCOUNTER — Telehealth (INDEPENDENT_AMBULATORY_CARE_PROVIDER_SITE_OTHER): Payer: 59 | Admitting: Psychiatry

## 2021-02-16 ENCOUNTER — Other Ambulatory Visit (HOSPITAL_COMMUNITY): Payer: Self-pay

## 2021-02-16 ENCOUNTER — Other Ambulatory Visit: Payer: Self-pay

## 2021-02-16 ENCOUNTER — Encounter (HOSPITAL_COMMUNITY): Payer: Self-pay | Admitting: Psychiatry

## 2021-02-16 DIAGNOSIS — F419 Anxiety disorder, unspecified: Secondary | ICD-10-CM

## 2021-02-16 DIAGNOSIS — F319 Bipolar disorder, unspecified: Secondary | ICD-10-CM | POA: Diagnosis not present

## 2021-02-16 MED ORDER — LAMOTRIGINE 200 MG PO TABS
ORAL_TABLET | Freq: Every day | ORAL | 1 refills | Status: DC
  Filled 2021-02-16: qty 60, 30d supply, fill #0

## 2021-02-16 MED ORDER — GABAPENTIN 100 MG PO CAPS
ORAL_CAPSULE | ORAL | 0 refills | Status: DC
  Filled 2021-02-16 – 2021-03-04 (×2): qty 45, 22d supply, fill #0

## 2021-02-16 MED ORDER — LAMOTRIGINE 25 MG PO TABS
50.0000 mg | ORAL_TABLET | Freq: Every day | ORAL | 1 refills | Status: DC
  Filled 2021-02-16 – 2021-03-04 (×2): qty 60, 30d supply, fill #0

## 2021-02-16 MED ORDER — BUPROPION HCL ER (XL) 150 MG PO TB24
ORAL_TABLET | Freq: Every day | ORAL | 0 refills | Status: DC
  Filled 2021-02-16 – 2021-03-04 (×2): qty 90, 90d supply, fill #0

## 2021-02-16 NOTE — Progress Notes (Signed)
Virtual Visit via Telephone Note  I connected with Paula Ortiz on 02/16/21 at 10:40 AM EDT by telephone and verified that I am speaking with the correct person using two identifiers.  Location: Patient: Home Provider: Home office   I discussed the limitations, risks, security and privacy concerns of performing an evaluation and management service by telephone and the availability of in person appointments. I also discussed with the patient that there may be a patient responsible charge related to this service. The patient expressed understanding and agreed to proceed.   History of Present Illness: Patient is evaluated by phone session.  On the last visit we started gabapentin as she noticed hydroxyzine is not helping as much with anxiety.  Patient admitted when she takes the gabapentin it helps anxiety but does not work for all day.  She still have a lot of mood lability, impulsivity and getting easily frustrated.  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  She is all the time on her feet while working.  She admitted mood swings, highs and lows but denies any suicidal thoughts, mania, hallucination or any homicidal thoughts.  She is moving to a new place and like the new place.  She admitted relationship with the partner is going well but concerned about her mood lability.  100 level is okay.  Her appetite is okay.  She denies any crying spells.  She has no tremors, rash or any itching.  Currently she is taking lamotrigine 200 mg along with Wellbutrin XL 150 mg daily.  She takes gabapentin as needed for anxiety.   Past Psychiatric History: Viewed. H/O Bipolar since age 1.  Saw Dr Yetta Barre at [facility]. Lamictal and Zoloft worked.  Tried Abilify.  No h/o inpatient or suicidal attempt. H/O cocaine, ETOH and THC but claims to be sober since 2018.   Psychiatric Specialty Exam: Physical Exam  Review of Systems  Weight 120 lb (54.4 kg).There  is no height or weight on file to calculate BMI.  General Appearance: NA  Eye Contact:  NA  Speech:  Normal Rate  Volume:  Normal  Mood:  Anxious and Irritable  Affect:  NA  Thought Process:  Goal Directed  Orientation:  NA  Thought Content:  Rumination  Suicidal Thoughts:  No  Homicidal Thoughts:  No  Memory:  Immediate;   Good Recent;   Good Remote;   Good  Judgement:  Intact  Insight:  Present  Psychomotor Activity:  NA  Concentration:  Concentration: Good and Attention Span: Fair  Recall:  Good  Fund of Knowledge:  Good  Language:  Good  Akathisia:  No  Handed:  Right  AIMS (if indicated):     Assets:  Communication Skills Desire for Improvement Housing Social Support Transportation  ADL's:  Intact  Cognition:  WNL  Sleep:   ok      Assessment and Plan: Bipolar disorder type I.  Anxiety.  Patient noticed increased irritability, mood swings and impulsivity.  Though she started working 25 hours a week mainly on the weekends as a Leisure centre manager but job stressful.  We discussed optimizing the dose of lamotrigine to 250-300 to target the mood lability and she agreed with the plan.  She like to keep the gabapentin 100 mg to take as needed for anxiety.  I will continue Wellbutrin XL 150 mg in the morning.  We will send a new prescription of lamotrigine  25 mg to take 2 tablet along with Lamictal 200 mg daily.  Recommended to call us back if she has any question or any concern.  Follow-up in 6 weeks.   Follow Up Instructions:    I discussed the assessment and treatment plan with the patient. The patient was provided an opportunity to ask questions and all were answered. The patient agreed with the plan and demonstrated an understanding of the instructions.   The patient was advised to call back or seek an in-person evaluation if the symptoms worsen or if the condition fails to improve as anticipated.  I provided 16 minutes of non-face-to-face time during this  encounter.   Cleotis Nipper, MD

## 2021-02-24 ENCOUNTER — Other Ambulatory Visit (HOSPITAL_COMMUNITY): Payer: Self-pay

## 2021-03-04 ENCOUNTER — Other Ambulatory Visit (HOSPITAL_COMMUNITY): Payer: Self-pay

## 2021-04-21 ENCOUNTER — Encounter (HOSPITAL_COMMUNITY): Payer: Self-pay | Admitting: Psychiatry

## 2021-04-21 ENCOUNTER — Telehealth (INDEPENDENT_AMBULATORY_CARE_PROVIDER_SITE_OTHER): Payer: 59 | Admitting: Psychiatry

## 2021-04-21 ENCOUNTER — Other Ambulatory Visit (HOSPITAL_COMMUNITY): Payer: Self-pay

## 2021-04-21 ENCOUNTER — Other Ambulatory Visit: Payer: Self-pay

## 2021-04-21 DIAGNOSIS — F419 Anxiety disorder, unspecified: Secondary | ICD-10-CM | POA: Diagnosis not present

## 2021-04-21 DIAGNOSIS — F319 Bipolar disorder, unspecified: Secondary | ICD-10-CM | POA: Diagnosis not present

## 2021-04-21 MED ORDER — BUPROPION HCL ER (XL) 150 MG PO TB24
ORAL_TABLET | Freq: Every day | ORAL | 0 refills | Status: AC
  Filled 2021-04-21: qty 90, fill #0

## 2021-04-21 MED ORDER — GABAPENTIN 100 MG PO CAPS
100.0000 mg | ORAL_CAPSULE | Freq: Three times a day (TID) | ORAL | 2 refills | Status: AC
  Filled 2021-04-21: qty 90, 30d supply, fill #0
  Filled 2021-06-01: qty 90, 30d supply, fill #1

## 2021-04-21 MED ORDER — LAMOTRIGINE 200 MG PO TABS
ORAL_TABLET | Freq: Every day | ORAL | 0 refills | Status: AC
  Filled 2021-04-21: qty 90, 90d supply, fill #0

## 2021-04-21 NOTE — Progress Notes (Signed)
Virtual Visit via Telephone Note  I connected with Paula Ortiz on 04/21/21 at  2:20 PM EDT by telephone and verified that I am speaking with the correct person using two identifiers.  Location: Patient: Home Provider: Home Office   I discussed the limitations, risks, security and privacy concerns of performing an evaluation and management service by telephone and the availability of in person appointments. I also discussed with the patient that there may be a patient responsible charge related to this service. The patient expressed understanding and agreed to proceed.   History of Present Illness: Patient is evaluated by phone session.  On the last visit we increased lamotrigine to 250 mg to help her residual mood lability.  Patient did not notice any improvement but admitted taking more gabapentin that helped her a lot.  Her mood swing, impulsivity and anxiety is much better with gabapentin to take up to 2 a day but she still sometimes have residual anxiety and nervousness.  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  She feels the gabapentin is helping her neuropathy pain and wondering if she can take higher dose.  So far she has no issues including tremor or shakes or any EPS.  She like to go back to Lamictal 200 mg since higher dose did not help.  She has upcoming appointment with her PCP at [facility] physician in October.  Patient denies any paranoia, hallucination or any suicidal thoughts.  Her energy level is good.  She denies any recent mania or any impulsive behavior.   Past Psychiatric History: Viewed. H/O Bipolar since age 79.  Saw Dr Yetta Barre at [facility]. Lamictal and Zoloft worked.  Tried Abilify.  No h/o inpatient or suicidal attempt. H/O cocaine, ETOH and THC but claims to be sober since 2018.   Psychiatric Specialty Exam: Physical Exam  Review of Systems  Weight 120 lb (54.4 kg).There is no height or weight on  file to calculate BMI.  General Appearance: NA  Eye Contact:  NA  Speech:  Normal Rate  Volume:  Normal  Mood:  Euthymic  Affect:  NA  Thought Process:  Coherent  Orientation:  Full (Time, Place, and Person)  Thought Content:  WDL  Suicidal Thoughts:  No  Homicidal Thoughts:  No  Memory:  Immediate;   Good Recent;   Good Remote;   Good  Judgement:  Intact  Insight:  Present  Psychomotor Activity:  NA  Concentration:  Concentration: Good and Attention Span: Good  Recall:  Good  Fund of Knowledge:  Good  Language:  Good  Akathisia:  No  Handed:  Right  AIMS (if indicated):     Assets:  Communication Skills Desire for Improvement Housing Resilience Transportation  ADL's:  Intact  Cognition:  WNL  Sleep:         Assessment and Plan: Bipolar disorder type I.  Anxiety.  Patient did not respond well with a higher dose of Lamictal and like to go back to 200 mg a day.  She noticed improvement with the gabapentin and wondering if she can take up to 3 times a day to help her residual anxiety and neuropathy pain.  I agreed that she can take a higher dose of gabapentin 100 mg up to 3 times a day as needed.  We will reduce back to Lamictal 200 mg daily and keep the Wellbutrin XL 150 mg in the morning.  Recommended to call us back if she has any question or any concern.  Follow-up in 3 months.  Follow Up Instructions:    I discussed the assessment and treatment plan with the patient. The patient was provided an opportunity to ask questions and all were answered. The patient agreed with the plan and demonstrated an understanding of the instructions.   The patient was advised to call back or seek an in-person evaluation if the symptoms worsen or if the condition fails to improve as anticipated.  I provided 18 minutes of non-face-to-face time during this encounter.   Cleotis Nipper, MD

## 2021-05-04 ENCOUNTER — Other Ambulatory Visit (HOSPITAL_COMMUNITY): Payer: Self-pay

## 2021-05-04 DIAGNOSIS — Z309 Encounter for contraceptive management, unspecified: Secondary | ICD-10-CM | POA: Diagnosis not present

## 2021-05-04 DIAGNOSIS — Z3042 Encounter for surveillance of injectable contraceptive: Secondary | ICD-10-CM | POA: Diagnosis not present

## 2021-05-04 MED ORDER — MEDROXYPROGESTERONE ACETATE 150 MG/ML IM SUSY
PREFILLED_SYRINGE | INTRAMUSCULAR | 0 refills | Status: AC
  Filled 2021-05-04: qty 1, 90d supply, fill #0

## 2021-06-01 ENCOUNTER — Other Ambulatory Visit (HOSPITAL_COMMUNITY): Payer: Self-pay

## 2021-06-10 DIAGNOSIS — R87613 High grade squamous intraepithelial lesion on cytologic smear of cervix (HGSIL): Secondary | ICD-10-CM | POA: Diagnosis not present

## 2021-06-10 DIAGNOSIS — Z13220 Encounter for screening for lipoid disorders: Secondary | ICD-10-CM | POA: Diagnosis not present

## 2021-06-10 DIAGNOSIS — D649 Anemia, unspecified: Secondary | ICD-10-CM | POA: Diagnosis not present

## 2021-06-10 DIAGNOSIS — Z0000 Encounter for general adult medical examination without abnormal findings: Secondary | ICD-10-CM | POA: Diagnosis not present

## 2021-06-10 DIAGNOSIS — Z124 Encounter for screening for malignant neoplasm of cervix: Secondary | ICD-10-CM | POA: Diagnosis not present

## 2021-07-13 ENCOUNTER — Other Ambulatory Visit (HOSPITAL_COMMUNITY): Payer: Self-pay

## 2021-07-13 MED ORDER — MEDROXYPROGESTERONE ACETATE 150 MG/ML IM SUSP
INTRAMUSCULAR | 0 refills | Status: AC
  Filled 2021-07-13: qty 1, 90d supply, fill #0

## 2021-07-14 ENCOUNTER — Other Ambulatory Visit (HOSPITAL_COMMUNITY): Payer: Self-pay

## 2021-07-20 ENCOUNTER — Telehealth (HOSPITAL_COMMUNITY): Payer: 59 | Admitting: Psychiatry

## 2021-07-20 ENCOUNTER — Encounter (HOSPITAL_COMMUNITY): Payer: Self-pay

## 2021-07-20 ENCOUNTER — Other Ambulatory Visit: Payer: Self-pay

## 2021-07-20 DIAGNOSIS — R7989 Other specified abnormal findings of blood chemistry: Secondary | ICD-10-CM | POA: Diagnosis not present

## 2021-07-20 DIAGNOSIS — Z3042 Encounter for surveillance of injectable contraceptive: Secondary | ICD-10-CM | POA: Diagnosis not present

## 2021-07-20 DIAGNOSIS — Z309 Encounter for contraceptive management, unspecified: Secondary | ICD-10-CM | POA: Diagnosis not present

## 2021-09-14 DIAGNOSIS — D061 Carcinoma in situ of exocervix: Secondary | ICD-10-CM | POA: Diagnosis not present

## 2021-09-14 DIAGNOSIS — N72 Inflammatory disease of cervix uteri: Secondary | ICD-10-CM | POA: Diagnosis not present

## 2021-09-14 DIAGNOSIS — B977 Papillomavirus as the cause of diseases classified elsewhere: Secondary | ICD-10-CM | POA: Diagnosis not present

## 2021-09-14 DIAGNOSIS — N871 Moderate cervical dysplasia: Secondary | ICD-10-CM | POA: Diagnosis not present

## 2021-09-14 DIAGNOSIS — R87613 High grade squamous intraepithelial lesion on cytologic smear of cervix (HGSIL): Secondary | ICD-10-CM | POA: Diagnosis not present

## 2021-09-14 DIAGNOSIS — N870 Mild cervical dysplasia: Secondary | ICD-10-CM | POA: Diagnosis not present

## 2021-09-28 ENCOUNTER — Other Ambulatory Visit (HOSPITAL_COMMUNITY): Payer: Self-pay

## 2021-09-28 MED ORDER — MEDROXYPROGESTERONE ACETATE 150 MG/ML IM SUSY
PREFILLED_SYRINGE | INTRAMUSCULAR | 0 refills | Status: AC
  Filled 2021-09-28: qty 1, 84d supply, fill #0

## 2021-10-02 ENCOUNTER — Telehealth (HOSPITAL_COMMUNITY): Payer: Self-pay | Admitting: *Deleted

## 2021-10-02 NOTE — Telephone Encounter (Signed)
VM left for pt regarding making a f/u appointment or if pt pt is seeing another provider please let office know. Pt no showed on last scheduled appointment on 07/20/21.

## 2021-10-05 DIAGNOSIS — Z3042 Encounter for surveillance of injectable contraceptive: Secondary | ICD-10-CM | POA: Diagnosis not present

## 2021-10-12 DIAGNOSIS — D061 Carcinoma in situ of exocervix: Secondary | ICD-10-CM | POA: Diagnosis not present

## 2021-10-12 DIAGNOSIS — N871 Moderate cervical dysplasia: Secondary | ICD-10-CM | POA: Diagnosis not present

## 2021-10-12 DIAGNOSIS — R87613 High grade squamous intraepithelial lesion on cytologic smear of cervix (HGSIL): Secondary | ICD-10-CM | POA: Diagnosis not present

## 2021-12-17 ENCOUNTER — Other Ambulatory Visit (HOSPITAL_COMMUNITY): Payer: Self-pay

## 2021-12-17 MED ORDER — MEDROXYPROGESTERONE ACETATE 150 MG/ML IM SUSY
PREFILLED_SYRINGE | INTRAMUSCULAR | 0 refills | Status: AC
  Filled 2021-12-17: qty 1, 90d supply, fill #0

## 2022-03-01 ENCOUNTER — Other Ambulatory Visit (HOSPITAL_COMMUNITY): Payer: Self-pay

## 2022-03-01 MED ORDER — MEDROXYPROGESTERONE ACETATE 150 MG/ML IM SUSP
INTRAMUSCULAR | 0 refills | Status: DC
  Filled 2022-03-01: qty 1, 90d supply, fill #0

## 2022-03-02 ENCOUNTER — Other Ambulatory Visit (HOSPITAL_COMMUNITY): Payer: Self-pay

## 2022-05-24 ENCOUNTER — Other Ambulatory Visit (HOSPITAL_COMMUNITY): Payer: Self-pay

## 2022-05-24 MED ORDER — MEDROXYPROGESTERONE ACETATE 150 MG/ML IM SUSP
150.0000 mg | INTRAMUSCULAR | 0 refills | Status: AC
  Filled 2022-05-24: qty 1, 90d supply, fill #0

## 2022-05-27 ENCOUNTER — Other Ambulatory Visit (HOSPITAL_COMMUNITY): Payer: Self-pay

## 2022-08-24 ENCOUNTER — Other Ambulatory Visit (HOSPITAL_COMMUNITY): Payer: Self-pay

## 2022-08-24 MED ORDER — MEDROXYPROGESTERONE ACETATE 150 MG/ML IM SUSY
1.0000 mL | PREFILLED_SYRINGE | INTRAMUSCULAR | 0 refills | Status: DC
  Filled 2022-08-24: qty 1, 90d supply, fill #0

## 2022-11-03 ENCOUNTER — Other Ambulatory Visit (HOSPITAL_COMMUNITY): Payer: Self-pay

## 2022-11-03 MED ORDER — MEDROXYPROGESTERONE ACETATE 150 MG/ML IM SUSY
150.0000 mg | PREFILLED_SYRINGE | INTRAMUSCULAR | 3 refills | Status: AC
  Filled 2022-11-03 – 2022-11-15 (×2): qty 1, 90d supply, fill #0
  Filled 2023-02-08: qty 1, 90d supply, fill #1
  Filled 2023-05-16: qty 1, 90d supply, fill #2

## 2022-11-12 ENCOUNTER — Other Ambulatory Visit (HOSPITAL_COMMUNITY): Payer: Self-pay

## 2022-11-15 ENCOUNTER — Other Ambulatory Visit (HOSPITAL_COMMUNITY): Payer: Self-pay

## 2023-02-08 ENCOUNTER — Other Ambulatory Visit: Payer: Self-pay

## 2023-03-28 ENCOUNTER — Other Ambulatory Visit (HOSPITAL_COMMUNITY): Payer: Self-pay

## 2023-04-20 ENCOUNTER — Other Ambulatory Visit (HOSPITAL_COMMUNITY): Payer: Self-pay

## 2023-04-20 MED ORDER — CEPHALEXIN 500 MG PO CAPS
500.0000 mg | ORAL_CAPSULE | Freq: Two times a day (BID) | ORAL | 0 refills | Status: AC
  Filled 2023-04-20: qty 10, 5d supply, fill #0

## 2023-05-05 ENCOUNTER — Other Ambulatory Visit (HOSPITAL_COMMUNITY): Payer: Self-pay

## 2023-05-16 ENCOUNTER — Other Ambulatory Visit (HOSPITAL_COMMUNITY): Payer: Self-pay

## 2023-08-03 ENCOUNTER — Other Ambulatory Visit (HOSPITAL_COMMUNITY): Payer: Self-pay

## 2023-08-03 MED ORDER — MEDROXYPROGESTERONE ACETATE 150 MG/ML IM SUSY
150.0000 mg | PREFILLED_SYRINGE | INTRAMUSCULAR | 0 refills | Status: AC
  Filled 2023-08-03: qty 1, 90d supply, fill #0

## 2023-10-28 ENCOUNTER — Other Ambulatory Visit (HOSPITAL_COMMUNITY): Payer: Self-pay

## 2023-10-28 MED ORDER — MEDROXYPROGESTERONE ACETATE 150 MG/ML IM SUSY
PREFILLED_SYRINGE | INTRAMUSCULAR | 0 refills | Status: DC
  Filled 2023-10-28: qty 1, 84d supply, fill #0

## 2024-01-17 ENCOUNTER — Other Ambulatory Visit (HOSPITAL_COMMUNITY): Payer: Self-pay

## 2024-01-17 MED ORDER — MEDROXYPROGESTERONE ACETATE 150 MG/ML IM SUSY
PREFILLED_SYRINGE | INTRAMUSCULAR | 0 refills | Status: DC
  Filled 2024-01-17: qty 1, 90d supply, fill #0

## 2024-04-02 ENCOUNTER — Other Ambulatory Visit (HOSPITAL_COMMUNITY): Payer: Self-pay

## 2024-04-02 MED ORDER — MEDROXYPROGESTERONE ACETATE 150 MG/ML IM SUSY
150.0000 mg | PREFILLED_SYRINGE | INTRAMUSCULAR | 0 refills | Status: DC
  Filled 2024-04-02: qty 1, 84d supply, fill #0

## 2024-07-04 ENCOUNTER — Other Ambulatory Visit (HOSPITAL_COMMUNITY): Payer: Self-pay

## 2024-07-04 MED ORDER — MEDROXYPROGESTERONE ACETATE 150 MG/ML IM SUSY
PREFILLED_SYRINGE | INTRAMUSCULAR | 0 refills | Status: DC
  Filled 2024-07-04: qty 1, 84d supply, fill #0

## 2024-09-25 ENCOUNTER — Other Ambulatory Visit (HOSPITAL_COMMUNITY): Payer: Self-pay

## 2024-09-25 MED ORDER — MEDROXYPROGESTERONE ACETATE 150 MG/ML IM SUSY
150.0000 mg | PREFILLED_SYRINGE | INTRAMUSCULAR | 0 refills | Status: AC
  Filled 2024-09-25: qty 1, 90d supply, fill #0
